# Patient Record
Sex: Female | Born: 1955
Health system: Southern US, Community
[De-identification: ages and names within clinical notes are randomized; demographics above are authoritative.]

## PROBLEM LIST (undated history)

## (undated) DIAGNOSIS — F329 Major depressive disorder, single episode, unspecified: Secondary | ICD-10-CM

## (undated) DIAGNOSIS — B019 Varicella without complication: Secondary | ICD-10-CM

## (undated) DIAGNOSIS — I10 Essential (primary) hypertension: Secondary | ICD-10-CM

## (undated) DIAGNOSIS — F32A Depression, unspecified: Secondary | ICD-10-CM

## (undated) DIAGNOSIS — E119 Type 2 diabetes mellitus without complications: Secondary | ICD-10-CM

## (undated) HISTORY — PX: BREAST BIOPSY: SHX20

## (undated) HISTORY — DX: Depression, unspecified: F32.A

## (undated) HISTORY — DX: Type 2 diabetes mellitus without complications: E11.9

## (undated) HISTORY — DX: Essential (primary) hypertension: I10

## (undated) HISTORY — DX: Varicella without complication: B01.9

## (undated) HISTORY — DX: Major depressive disorder, single episode, unspecified: F32.9

---

## 2014-04-15 ENCOUNTER — Encounter: Payer: Self-pay | Admitting: Nurse Practitioner

## 2014-04-15 ENCOUNTER — Ambulatory Visit (INDEPENDENT_AMBULATORY_CARE_PROVIDER_SITE_OTHER): Payer: BC Managed Care – PPO | Admitting: Nurse Practitioner

## 2014-04-15 VITALS — BP 122/79 | HR 74 | Temp 97.8°F | Ht 66.0 in | Wt 210.0 lb

## 2014-04-15 DIAGNOSIS — Z0000 Encounter for general adult medical examination without abnormal findings: Secondary | ICD-10-CM

## 2014-04-15 DIAGNOSIS — Z6831 Body mass index (BMI) 31.0-31.9, adult: Secondary | ICD-10-CM

## 2014-04-15 DIAGNOSIS — I10 Essential (primary) hypertension: Secondary | ICD-10-CM

## 2014-04-15 LAB — CBC WITH DIFFERENTIAL/PLATELET
BASOS PCT: 0.3 % (ref 0.0–3.0)
Basophils Absolute: 0 10*3/uL (ref 0.0–0.1)
EOS PCT: 2.9 % (ref 0.0–5.0)
Eosinophils Absolute: 0.2 10*3/uL (ref 0.0–0.7)
HCT: 43.5 % (ref 36.0–46.0)
Hemoglobin: 14.7 g/dL (ref 12.0–15.0)
Lymphocytes Relative: 22.5 % (ref 12.0–46.0)
Lymphs Abs: 1.2 10*3/uL (ref 0.7–4.0)
MCHC: 33.9 g/dL (ref 30.0–36.0)
MCV: 85.7 fl (ref 78.0–100.0)
MONOS PCT: 5 % (ref 3.0–12.0)
Monocytes Absolute: 0.3 10*3/uL (ref 0.1–1.0)
NEUTROS PCT: 69.3 % (ref 43.0–77.0)
Neutro Abs: 3.7 10*3/uL (ref 1.4–7.7)
Platelets: 270 10*3/uL (ref 150.0–400.0)
RBC: 5.08 Mil/uL (ref 3.87–5.11)
RDW: 12.9 % (ref 11.5–15.5)
WBC: 5.3 10*3/uL (ref 4.0–10.5)

## 2014-04-15 LAB — COMPREHENSIVE METABOLIC PANEL
ALT: 16 U/L (ref 0–35)
AST: 18 U/L (ref 0–37)
Albumin: 4.4 g/dL (ref 3.5–5.2)
Alkaline Phosphatase: 107 U/L (ref 39–117)
BUN: 13 mg/dL (ref 6–23)
CHLORIDE: 105 meq/L (ref 96–112)
CO2: 32 meq/L (ref 19–32)
Calcium: 9.4 mg/dL (ref 8.4–10.5)
Creatinine, Ser: 0.89 mg/dL (ref 0.40–1.20)
GFR: 68.74 mL/min (ref >60.00)
Glucose, Bld: 99 mg/dL (ref 70–99)
Potassium: 4.2 mEq/L (ref 3.5–5.1)
Sodium: 140 mEq/L (ref 135–145)
Total Bilirubin: 0.6 mg/dL (ref 0.2–1.2)
Total Protein: 7.1 g/dL (ref 6.0–8.3)

## 2014-04-15 LAB — MICROALBUMIN / CREATININE URINE RATIO
Creatinine,U: 258.2 mg/dL
Microalb Creat Ratio: 0.5 mg/g (ref 0.0–30.0)
Microalb, Ur: 1.4 mg/dL (ref 0.0–1.9)

## 2014-04-15 LAB — LIPID PANEL
Cholesterol: 220 mg/dL — ABNORMAL HIGH (ref 0–200)
HDL: 56.1 mg/dL (ref >39.00)
LDL CALC: 135 mg/dL — AB (ref 0–99)
NonHDL: 163.9
Total CHOL/HDL Ratio: 4
Triglycerides: 143 mg/dL (ref 0.0–149.0)
VLDL: 28.6 mg/dL (ref 0.0–40.0)

## 2014-04-15 LAB — TSH: TSH: 1.8 u[IU]/mL (ref 0.35–4.50)

## 2014-04-15 LAB — T4, FREE: Free T4: 0.68 ng/dL (ref 0.60–1.60)

## 2014-04-15 LAB — HEMOGLOBIN A1C: Hgb A1c MFr Bld: 6 % (ref 4.6–6.5)

## 2014-04-15 LAB — VITAMIN D 25 HYDROXY (VIT D DEFICIENCY, FRACTURES): VITD: 18.28 ng/mL — ABNORMAL LOW (ref 30.00–100.00)

## 2014-04-15 NOTE — Progress Notes (Signed)
Pre visit review using our clinic review tool, if applicable. No additional management support is needed unless otherwise documented below in the visit note. 

## 2014-04-15 NOTE — Patient Instructions (Signed)
Our office will call you with lab results. Diet changes:  Cut out refined sugar- anything that is sweet when you eat or drink it except fresh fruit.  Cut out refined grains-white bread, rolls, biscuits, bagels, muffins, pasta and cereals. Choose Bread, cereals, rice, pasta that have 4 gm or more of fiber per serving.   Exercise:  take a 30 minute walk every day. The benefits include weight loss, lower risk for heart disease, diabetes, stroke, high blood pressure, lower rates of depression & dementia, better sleep quality & bone health. Weight loss goal is 30 pounds for better health.  I will see you in 3-4 weeks.  Pleasure to meet you!  Preventive Care for Adults, Female A healthy lifestyle and preventive care can promote health and wellness. Preventive health guidelines for women include the following key practices.  A routine yearly physical is a good way to check with your caregiver about your health and preventive screening. It is a chance to share any concerns and updates on your health, and to receive a thorough exam.  Visit your dentist for a routine exam and preventive care every 6 months. Brush your teeth twice a day and floss once a day. Good oral hygiene prevents tooth decay and gum disease.  The frequency of eye exams is based on your age, health, family medical history, use of contact lenses, and other factors. Follow your caregiver's recommendations for frequency of eye exams.  Eat a healthy diet. Foods like vegetables, fruits, whole grains, low-fat dairy products, and lean protein foods contain the nutrients you need without too many calories. Decrease your intake of foods high in solid fats, added sugars, and salt. Eat the right amount of calories for you.Get information about a proper diet from your caregiver, if necessary.  Regular physical exercise is one of the most important things you can do for your health. Most adults should get at least 150 minutes of moderate-intensity  exercise (any activity that increases your heart rate and causes you to sweat) each week. In addition, most adults need muscle-strengthening exercises on 2 or more days a week.  Maintain a healthy weight. The body mass index (BMI) is a screening tool to identify possible weight problems. It provides an estimate of body fat based on height and weight. Your caregiver can help determine your BMI, and can help you achieve or maintain a healthy weight.For adults 20 years and older:  A BMI below 18.5 is considered underweight.  A BMI of 18.5 to 24.9 is normal.  A BMI of 25 to 29.9 is considered overweight.  A BMI of 30 and above is considered obese.  Maintain normal blood lipids and cholesterol levels by exercising and minimizing your intake of saturated fat. Eat a balanced diet with plenty of fruit and vegetables. Blood tests for lipids and cholesterol should begin at age 4 and be repeated every 5 years. If your lipid or cholesterol levels are high, you are over 50, or you are at high risk for heart disease, you may need your cholesterol levels checked more frequently.Ongoing high lipid and cholesterol levels should be treated with medicines if diet and exercise are not effective.  If you smoke, find out from your caregiver how to quit. If you do not use tobacco, do not start.  Lung cancer screening is recommended for adults aged 36 80 years who are at high risk for developing lung cancer because of a history of smoking. Yearly low-dose computed tomography (CT) is recommended for people  who have at least a 30-pack-year history of smoking and are a current smoker or have quit within the past 15 years. A pack year of smoking is smoking an average of 1 pack of cigarettes a day for 1 year (for example: 1 pack a day for 30 years or 2 packs a day for 15 years). Yearly screening should continue until the smoker has stopped smoking for at least 15 years. Yearly screening should also be stopped for people who  develop a health problem that would prevent them from having lung cancer treatment.  If you are pregnant, do not drink alcohol. If you are breastfeeding, be very cautious about drinking alcohol. If you are not pregnant and choose to drink alcohol, do not exceed 1 drink per day. One drink is considered to be 12 ounces (355 mL) of beer, 5 ounces (148 mL) of wine, or 1.5 ounces (44 mL) of liquor.  Avoid use of street drugs. Do not share needles with anyone. Ask for help if you need support or instructions about stopping the use of drugs.  High blood pressure causes heart disease and increases the risk of stroke. Your blood pressure should be checked at least every 1 to 2 years. Ongoing high blood pressure should be treated with medicines if weight loss and exercise are not effective.  If you are 15 to 60 years old, ask your caregiver if you should take aspirin to prevent strokes.  Diabetes screening involves taking a blood sample to check your fasting blood sugar level. This should be done once every 3 years, after age 73, if you are within normal weight and without risk factors for diabetes. Testing should be considered at a younger age or be carried out more frequently if you are overweight and have at least 1 risk factor for diabetes.  Breast cancer screening is essential preventive care for women. You should practice "breast self-awareness." This means understanding the normal appearance and feel of your breasts and may include breast self-examination. Any changes detected, no matter how small, should be reported to a caregiver. Women in their 73s and 30s should have a clinical breast exam (CBE) by a caregiver as part of a regular health exam every 1 to 3 years. After age 45, women should have a CBE every year. Starting at age 48, women should consider having a mammography (breast X-ray test) every year. Women who have a family history of breast cancer should talk to their caregiver about genetic  screening. Women at a high risk of breast cancer should talk to their caregivers about having magnetic resonance imaging (MRI) and a mammography every year.  Breast cancer gene (BRCA)-related cancer risk assessment is recommended for women who have family members with BRCA-related cancers. BRCA-related cancers include breast, ovarian, tubal, and peritoneal cancers. Having family members with these cancers may be associated with an increased risk for harmful changes (mutations) in the breast cancer genes BRCA1 and BRCA2. Results of the assessment will determine the need for genetic counseling and BRCA1 and BRCA2 testing.  The Pap test is a screening test for cervical cancer. A Pap test can show cell changes on the cervix that might become cervical cancer if left untreated. A Pap test is a procedure in which cells are obtained and examined from the lower end of the uterus (cervix).  Women should have a Pap test starting at age 37.  Between ages 35 and 6, Pap tests should be repeated every 2 years.  Beginning at age 54,  you should have a Pap test every 3 years as long as the past 3 Pap tests have been normal.  Some women have medical problems that increase the chance of getting cervical cancer. Talk to your caregiver about these problems. It is especially important to talk to your caregiver if a new problem develops soon after your last Pap test. In these cases, your caregiver may recommend more frequent screening and Pap tests.  The above recommendations are the same for women who have or have not gotten the vaccine for human papillomavirus (HPV).  If you had a hysterectomy for a problem that was not cancer or a condition that could lead to cancer, then you no longer need Pap tests. Even if you no longer need a Pap test, a regular exam is a good idea to make sure no other problems are starting.  If you are between ages 44 and 67, and you have had normal Pap tests going back 10 years, you no longer  need Pap tests. Even if you no longer need a Pap test, a regular exam is a good idea to make sure no other problems are starting.  If you have had past treatment for cervical cancer or a condition that could lead to cancer, you need Pap tests and screening for cancer for at least 20 years after your treatment.  If Pap tests have been discontinued, risk factors (such as a new sexual partner) need to be reassessed to determine if screening should be resumed.  The HPV test is an additional test that may be used for cervical cancer screening. The HPV test looks for the virus that can cause the cell changes on the cervix. The cells collected during the Pap test can be tested for HPV. The HPV test could be used to screen women aged 41 years and older, and should be used in women of any age who have unclear Pap test results. After the age of 70, women should have HPV testing at the same frequency as a Pap test.  Colorectal cancer can be detected and often prevented. Most routine colorectal cancer screening begins at the age of 55 and continues through age 48. However, your caregiver may recommend screening at an earlier age if you have risk factors for colon cancer. On a yearly basis, your caregiver may provide home test kits to check for hidden blood in the stool. Use of a small camera at the end of a tube, to directly examine the colon (sigmoidoscopy or colonoscopy), can detect the earliest forms of colorectal cancer. Talk to your caregiver about this at age 86, when routine screening begins. Direct examination of the colon should be repeated every 5 to 10 years through age 52, unless early forms of pre-cancerous polyps or small growths are found.  Hepatitis C blood testing is recommended for all people born from [birth year] through 1965 and any individual with known risks for hepatitis C.  Practice safe sex. Use condoms and avoid high-risk sexual practices to reduce the spread of sexually transmitted infections  (STIs). STIs include gonorrhea, chlamydia, syphilis, trichomonas, herpes, HPV, and human immunodeficiency virus (HIV). Herpes, HIV, and HPV are viral illnesses that have no cure. They can result in disability, cancer, and death. Sexually active women aged 23 and younger should be checked for chlamydia. Older women with new or multiple partners should also be tested for chlamydia. Testing for other STIs is recommended if you are sexually active and at increased risk.  Osteoporosis is a disease  in which the bones lose minerals and strength with aging. This can result in serious bone fractures. The risk of osteoporosis can be identified using a bone density scan. Women ages 74 and over and women at risk for fractures or osteoporosis should discuss screening with their caregivers. Ask your caregiver whether you should take a calcium supplement or vitamin D to reduce the rate of osteoporosis.  Menopause can be associated with physical symptoms and risks. Hormone replacement therapy is available to decrease symptoms and risks. You should talk to your caregiver about whether hormone replacement therapy is right for you.  Use sunscreen. Apply sunscreen liberally and repeatedly throughout the day. You should seek shade when your shadow is shorter than you. Protect yourself by wearing long sleeves, pants, a wide-brimmed hat, and sunglasses year round, whenever you are outdoors.  Once a month, do a whole body skin exam, using a mirror to look at the skin on your back. Notify your caregiver of new moles, moles that have irregular borders, moles that are larger than a pencil eraser, or moles that have changed in shape or color.  Stay current with required immunizations.  Influenza vaccine. All adults should be immunized every year.  Tetanus, diphtheria, and acellular pertussis (Td, Tdap) vaccine. Pregnant women should receive 1 dose of Tdap vaccine during each pregnancy. The dose should be obtained regardless of  the length of time since the last dose. Immunization is preferred during the 27th to 36th week of gestation. An adult who has not previously received Tdap or who does not know her vaccine status should receive 1 dose of Tdap. This initial dose should be followed by tetanus and diphtheria toxoids (Td) booster doses every 10 years. Adults with an unknown or incomplete history of completing a 3-dose immunization series with Td-containing vaccines should begin or complete a primary immunization series including a Tdap dose. Adults should receive a Td booster every 10 years.  Varicella vaccine. An adult without evidence of immunity to varicella should receive 2 doses or a second dose if she has previously received 1 dose. Pregnant females who do not have evidence of immunity should receive the first dose after pregnancy. This first dose should be obtained before leaving the health care facility. The second dose should be obtained 4 8 weeks after the first dose.  Human papillomavirus (HPV) vaccine. Females aged 74 26 years who have not received the vaccine previously should obtain the 3-dose series. The vaccine is not recommended for use in pregnant females. However, pregnancy testing is not needed before receiving a dose. If a female is found to be pregnant after receiving a dose, no treatment is needed. In that case, the remaining doses should be delayed until after the pregnancy. Immunization is recommended for any person with an immunocompromised condition through the age of 14 years if she did not get any or all doses earlier. During the 3-dose series, the second dose should be obtained 4 8 weeks after the first dose. The third dose should be obtained 24 weeks after the first dose and 16 weeks after the second dose.  Zoster vaccine. One dose is recommended for adults aged 75 years or older unless certain conditions are present.  Measles, mumps, and rubella (MMR) vaccine. Adults born before [birth year] generally are  considered immune to measles and mumps. Adults born in [birth year] or later should have 1 or more doses of MMR vaccine unless there is a contraindication to the vaccine or there is laboratory evidence of immunity  to each of the three diseases. A routine second dose of MMR vaccine should be obtained at least 28 days after the first dose for students attending postsecondary schools, health care workers, or international travelers. People who received inactivated measles vaccine or an unknown type of measles vaccine during 1963 1967 should receive 2 doses of MMR vaccine. People who received inactivated mumps vaccine or an unknown type of mumps vaccine before 1979 and are at high risk for mumps infection should consider immunization with 2 doses of MMR vaccine. For females of childbearing age, rubella immunity should be determined. If there is no evidence of immunity, females who are not pregnant should be vaccinated. If there is no evidence of immunity, females who are pregnant should delay immunization until after pregnancy. Unvaccinated health care workers born before [birth year] who lack laboratory evidence of measles, mumps, or rubella immunity or laboratory confirmation of disease should consider measles and mumps immunization with 2 doses of MMR vaccine or rubella immunization with 1 dose of MMR vaccine.  Pneumococcal 13-valent conjugate (PCV13) vaccine. When indicated, a person who is uncertain of her immunization history and has no record of immunization should receive the PCV13 vaccine. An adult aged 50 years or older who has certain medical conditions and has not been previously immunized should receive 1 dose of PCV13 vaccine. This PCV13 should be followed with a dose of pneumococcal polysaccharide (PPSV23) vaccine. The PPSV23 vaccine dose should be obtained at least 8 weeks after the dose of PCV13 vaccine. An adult aged 20 years or older who has certain medical conditions and previously received 1 or more doses of  PPSV23 vaccine should receive 1 dose of PCV13. The PCV13 vaccine dose should be obtained 1 or more years after the last PPSV23 vaccine dose.  Pneumococcal polysaccharide (PPSV23) vaccine. When PCV13 is also indicated, PCV13 should be obtained first. All adults aged 56 years and older should be immunized. An adult younger than age 62 years who has certain medical conditions should be immunized. Any person who resides in a nursing home or long-term care facility should be immunized. An adult smoker should be immunized. People with an immunocompromised condition and certain other conditions should receive both PCV13 and PPSV23 vaccines. People with human immunodeficiency virus (HIV) infection should be immunized as soon as possible after diagnosis. Immunization during chemotherapy or radiation therapy should be avoided. Routine use of PPSV23 vaccine is not recommended for American Indians, [place] Natives, or people younger than 65 years unless there are medical conditions that require PPSV23 vaccine. When indicated, people who have unknown immunization and have no record of immunization should receive PPSV23 vaccine. One-time revaccination 5 years after the first dose of PPSV23 is recommended for people aged 30 64 years who have chronic kidney failure, nephrotic syndrome, asplenia, or immunocompromised conditions. People who received 1 2 doses of PPSV23 before age 38 years should receive another dose of PPSV23 vaccine at age 17 years or later if at least 5 years have passed since the previous dose. Doses of PPSV23 are not needed for people immunized with PPSV23 at or after age 58 years.  Meningococcal vaccine. Adults with asplenia or persistent complement component deficiencies should receive 2 doses of quadrivalent meningococcal conjugate (MenACWY-D) vaccine. The doses should be obtained at least 2 months apart. Microbiologists working with certain meningococcal bacteria, [place] [employer], people at risk during  an outbreak, and people who travel to or live in countries with a high rate of meningitis should be immunized. A first-year college  student up through age 2 years who is living in a residence hall should receive a dose if she did not receive a dose on or after her 16th birthday. Adults who have certain high-risk conditions should receive one or more doses of vaccine.  Hepatitis A vaccine. Adults who wish to be protected from this disease, have certain high-risk conditions, work with hepatitis A-infected animals, work in hepatitis A research labs, or travel to or work in countries with a high rate of hepatitis A should be immunized. Adults who were previously unvaccinated and who anticipate close contact with an international adoptee during the first 60 days after arrival in the [place] from a country with a high rate of hepatitis A should be immunized.  Hepatitis B vaccine. Adults who wish to be protected from this disease, have certain high-risk conditions, may be exposed to blood or other infectious body fluids, are household contacts or sex partners of hepatitis B positive people, are clients or workers in certain care facilities, or travel to or work in countries with a high rate of hepatitis B should be immunized.  Haemophilus influenzae type b (Hib) vaccine. A previously unvaccinated person with asplenia or sickle cell disease or having a scheduled splenectomy should receive 1 dose of Hib vaccine. Regardless of previous immunization, a recipient of a hematopoietic stem cell transplant should receive a 3-dose series 6 12 months after her successful transplant. Hib vaccine is not recommended for adults with HIV infection. Preventive Services / Frequency Ages 29 to 37  Blood pressure check.** / Every 1 to 2 years.  Lipid and cholesterol check.** / Every 5 years beginning at age 73.  Clinical breast exam.** / Every 3 years for women in their 28s and 66s.  BRCA-related cancer risk  assessment.** / For women who have family members with a BRCA-related cancer (breast, ovarian, tubal, or peritoneal cancers).  Pap test.** / Every 2 years from ages 66 through 79. Every 3 years starting at age 51 through age 44 or 23 with a history of 3 consecutive normal Pap tests.  HPV screening.** / Every 3 years from ages 45 through ages 70 to 21 with a history of 3 consecutive normal Pap tests.  Hepatitis C blood test.** / For any individual with known risks for hepatitis C.  Skin self-exam. / Monthly.  Influenza vaccine. / Every year.  Tetanus, diphtheria, and acellular pertussis (Tdap, Td) vaccine.** / Consult your caregiver. Pregnant women should receive 1 dose of Tdap vaccine during each pregnancy. 1 dose of Td every 10 years.  Varicella vaccine.** / Consult your caregiver. Pregnant females who do not have evidence of immunity should receive the first dose after pregnancy.  HPV vaccine. / 3 doses over 6 months, if 37 and younger. The vaccine is not recommended for use in pregnant females. However, pregnancy testing is not needed before receiving a dose.  Measles, mumps, rubella (MMR) vaccine.** / You need at least 1 dose of MMR if you were born in 1957 or later. You may also need a 2nd dose. For females of childbearing age, rubella immunity should be determined. If there is no evidence of immunity, females who are not pregnant should be vaccinated. If there is no evidence of immunity, females who are pregnant should delay immunization until after pregnancy.  Pneumococcal 13-valent conjugate (PCV13) vaccine.** / Consult your caregiver.  Pneumococcal polysaccharide (PPSV23) vaccine.** / 1 to 2 doses if you smoke cigarettes or if you have certain conditions.  Meningococcal vaccine.** / 1  dose if you are age 55 to 56 years and a Market researcher living in a residence hall, or have one of several medical conditions, you need to get vaccinated against meningococcal disease. You  may also need additional booster doses.  Hepatitis A vaccine.** / Consult your caregiver.  Hepatitis B vaccine.** / Consult your caregiver.  Haemophilus influenzae type b (Hib) vaccine.** / Consult your caregiver. Ages 61 to 66  Blood pressure check.** / Every 1 to 2 years.  Lipid and cholesterol check.** / Every 5 years beginning at age 62.  Lung cancer screening. / Every year if you are aged 43 80 years and have a 30-pack-year history of smoking and currently smoke or have quit within the past 15 years. Yearly screening is stopped once you have quit smoking for at least 15 years or develop a health problem that would prevent you from having lung cancer treatment.  Clinical breast exam.** / Every year after age 61.  BRCA-related cancer risk assessment.** / For women who have family members with a BRCA-related cancer (breast, ovarian, tubal, or peritoneal cancers).  Mammogram.** / Every year beginning at age 75 and continuing for as long as you are in good health. Consult with your caregiver.  Pap test.** / Every 3 years starting at age 88 through age 66 or 36 with a history of 3 consecutive normal Pap tests.  HPV screening.** / Every 3 years from ages 31 through ages 41 to 71 with a history of 3 consecutive normal Pap tests.  Fecal occult blood test (FOBT) of stool. / Every year beginning at age 37 and continuing until age 52. You may not need to do this test if you get a colonoscopy every 10 years.  Flexible sigmoidoscopy or colonoscopy.** / Every 5 years for a flexible sigmoidoscopy or every 10 years for a colonoscopy beginning at age 72 and continuing until age 36.  Hepatitis C blood test.** / For all people born from [birth year] through 1966 and any individual with known risks for hepatitis C.  Skin self-exam. / Monthly.  Influenza vaccine. / Every year.  Tetanus, diphtheria, and acellular pertussis (Tdap/Td) vaccine.** / Consult your caregiver. Pregnant women should receive 1 dose  of Tdap vaccine during each pregnancy. 1 dose of Td every 10 years.  Varicella vaccine.** / Consult your caregiver. Pregnant females who do not have evidence of immunity should receive the first dose after pregnancy.  Zoster vaccine.** / 1 dose for adults aged 47 years or older.  Measles, mumps, rubella (MMR) vaccine.** / You need at least 1 dose of MMR if you were born in 1957 or later. You may also need a 2nd dose. For females of childbearing age, rubella immunity should be determined. If there is no evidence of immunity, females who are not pregnant should be vaccinated. If there is no evidence of immunity, females who are pregnant should delay immunization until after pregnancy.  Pneumococcal 13-valent conjugate (PCV13) vaccine.** / Consult your caregiver.  Pneumococcal polysaccharide (PPSV23) vaccine.** / 1 to 2 doses if you smoke cigarettes or if you have certain conditions.  Meningococcal vaccine.** / Consult your caregiver.  Hepatitis A vaccine.** / Consult your caregiver.  Hepatitis B vaccine.** / Consult your caregiver.  Haemophilus influenzae type b (Hib) vaccine.** / Consult your caregiver. Ages 43 and over  Blood pressure check.** / Every 1 to 2 years.  Lipid and cholesterol check.** / Every 5 years beginning at age 79.  Lung cancer screening. / Every year if you  are aged 52 80 years and have a 30-pack-year history of smoking and currently smoke or have quit within the past 15 years. Yearly screening is stopped once you have quit smoking for at least 15 years or develop a health problem that would prevent you from having lung cancer treatment.  Clinical breast exam.** / Every year after age 42.  BRCA-related cancer risk assessment.** / For women who have family members with a BRCA-related cancer (breast, ovarian, tubal, or peritoneal cancers).  Mammogram.** / Every year beginning at age 6 and continuing for as long as you are in good health. Consult with your  caregiver.  Pap test.** / Every 3 years starting at age 35 through age 15 or 15 with a 3 consecutive normal Pap tests. Testing can be stopped between 65 and 70 with 3 consecutive normal Pap tests and no abnormal Pap or HPV tests in the past 10 years.  HPV screening.** / Every 3 years from ages 29 through ages 36 or 14 with a history of 3 consecutive normal Pap tests. Testing can be stopped between 65 and 70 with 3 consecutive normal Pap tests and no abnormal Pap or HPV tests in the past 10 years.  Fecal occult blood test (FOBT) of stool. / Every year beginning at age 29 and continuing until age 57. You may not need to do this test if you get a colonoscopy every 10 years.  Flexible sigmoidoscopy or colonoscopy.** / Every 5 years for a flexible sigmoidoscopy or every 10 years for a colonoscopy beginning at age 51 and continuing until age 73.  Hepatitis C blood test.** / For all people born from [birth year] through 1966 and any individual with known risks for hepatitis C.  Osteoporosis screening.** / A one-time screening for women ages 29 and over and women at risk for fractures or osteoporosis.  Skin self-exam. / Monthly.  Influenza vaccine. / Every year.  Tetanus, diphtheria, and acellular pertussis (Tdap/Td) vaccine.** / 1 dose of Td every 10 years.  Varicella vaccine.** / Consult your caregiver.  Zoster vaccine.** / 1 dose for adults aged 27 years or older.  Pneumococcal 13-valent conjugate (PCV13) vaccine.** / Consult your caregiver.  Pneumococcal polysaccharide (PPSV23) vaccine.** / 1 dose for all adults aged 59 years and older.  Meningococcal vaccine.** / Consult your caregiver.  Hepatitis A vaccine.** / Consult your caregiver.  Hepatitis B vaccine.** / Consult your caregiver.  Haemophilus influenzae type b (Hib) vaccine.** / Consult your caregiver. ** Family history and personal history of risk and conditions may change your caregiver's recommendations. Document Released: 03/15/2001  Document Revised: 05/14/2012 Document Reviewed: 06/14/2010 [facility] Patient Information 2014 [place], [place].

## 2014-04-16 ENCOUNTER — Telehealth: Payer: Self-pay | Admitting: Nurse Practitioner

## 2014-04-16 LAB — HIV ANTIBODY (ROUTINE TESTING W REFLEX): HIV 1&2 Ab, 4th Generation: NONREACTIVE

## 2014-04-16 NOTE — Telephone Encounter (Signed)
emmi emailed °

## 2014-04-16 NOTE — Telephone Encounter (Signed)
pls call pt: Advise Labs: she has vit d deficiency. Start D3 5000 iu daily w/meal. She has prediabetes. Continue with diet changes discussed in ofc. We will discuss further at f/u appt.

## 2014-04-16 NOTE — Telephone Encounter (Signed)
Called and informed patient of results.

## 2014-04-17 ENCOUNTER — Encounter: Payer: Self-pay | Admitting: Nurse Practitioner

## 2014-04-17 NOTE — Progress Notes (Signed)
Subjective:     Diana Rios is a 59 y.o. female and is here to establish care. The patient reports problems - HTN, depression, and desire to lose weight.. She relocated from [facility]. Historical care w/ Dr Dicie Beam health. Last PP 2014 nml. Last MMG 1/15 nml. Prev abnml-chip in breast. Went through menopause about 5 yrs ago. HTN: current med lisinopril 40 mg qd. No intolerable SE. RF: obese, sedentary.    Depression: current meds cymbalta 60 mg qd, wellbutrin 150 mg recently added. Taking cymbalta for several yrs. Symptoms controlled. desire to lose weight: struggled w/wt as adult. Used weight watcher's for years with success-lost 40 lbs. Always felt [place]. Didn't keep off. Eats fair amount of sugar, drinks diet sodas. Not exercsiing History   Social History  . Marital Status: Married    Spouse Name: N/A  . Number of Children: 2  . Years of Education: [employer]   Occupational History  . Geologist, engineering     Retired   Social History Main Topics  . Smoking status: Never Smoker   . Smokeless tobacco: Not on file  . Alcohol Use: No  . Drug Use: No  . Sexual Activity: Yes    Birth Control/ Protection: Post-menopausal   Other Topics Concern  . Not on file   Social History Narrative   Ms Bahr relocated from [place], [place]. She lived in [place], [place] for short while. 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   Health Maintenance  Topic Date Due  . ZOSTAVAX  03/15/2014  . COLONOSCOPY  04/15/2015 (Originally 03/15/2004)  . INFLUENZA VACCINE  09/01/2014  . MAMMOGRAM  04/15/2015  . PAP SMEAR  04/15/2015  . TETANUS/TDAP  04/14/2020  . HIV Screening  Completed    The following portions of the patient's history were reviewed and updated as appropriate: allergies, current medications, past family history, past medical history, past social history, past surgical history and problem list.  Review of Systems Constitutional: negative for chills, fatigue and  fevers Eyes: negative for visual disturbance Ears, nose, mouth, throat, and face: negative for hoarseness Respiratory: negative for cough Cardiovascular: negative for chest pressure/discomfort and irregular heart beat Gastrointestinal: negative for constipation, diarrhea and reflux symptoms Genitourinary:negative for postmenopausal bleeding Musculoskeletal:positive for arthralgias and hands Neurological: negative for headaches Behavioral/Psych: negative for excessive alcohol consumption and sleep disturbance   Objective:    BP 122/79 mmHg  Pulse 74  Temp(Src) 97.8 F (36.6 C) (Temporal)  Ht  (1.676 m)  Wt 210 lb (95.255 kg)  BMI 33.91 kg/m2  SpO2 96% General appearance: alert, cooperative, appears stated age and no distress Head: Normocephalic, without obvious abnormality, atraumatic Eyes: negative findings: lids and lashes normal, conjunctivae and sclerae normal, corneas clear and pupils equal, round, reactive to light and accomodation Ears: normal TM's and external ear canals both ears Throat: lips, mucosa, and tongue normal; teeth and gums normal Neck: no adenopathy, no carotid bruit, supple, symmetrical, trachea midline and thyroid not enlarged, symmetric, no tenderness/mass/nodules Back: no kyphosis present, range of motion normal Lungs: clear to auscultation bilaterally Heart: regular rate and rhythm, S1, S2 normal, no murmur, click, rub or gallop Abdomen: soft, non-tender; bowel sounds normal; no masses,  no organomegaly Extremities: extremities normal, atraumatic, no cyanosis or edema Pulses: 2+ and symmetric Lymph nodes: Cervical adenopathy: none and Supraclavicular adenopathy: none Neurologic: Grossly normal    Assessment:Plan  1. BMI 31.0-31.9,adult Discussed health reasons to normalize BMI Discussed specific diet changes & activity Gave hand out. -  TSH - T4, free - Lipid panel - Hemoglobin A1c  2. Preventative health care Encourage reg exercise - CBC  with Differential/Platelet - Comprehensive metabolic panel - Microalbumin / creatinine urine ratio - TSH - T4, free - Lipid panel - Hemoglobin A1c - HIV antibody - Vit D  25 hydroxy (rtn osteoporosis monitoring) - MM DIGITAL SCREENING BILATERAL; Future  3. Essential hypertension Continue lisinopril Exercise Wt loss - Comprehensive metabolic panel - Microalbumin / creatinine urine ratio  F/u 3-4 weeks-wt check, nutrition counseling

## 2014-04-21 ENCOUNTER — Encounter: Payer: Self-pay | Admitting: Nurse Practitioner

## 2014-04-30 ENCOUNTER — Telehealth: Payer: Self-pay

## 2014-04-30 NOTE — Telephone Encounter (Signed)
Patient called back and she said that she believed she has a bill for $104.00 for the HIV test. Patient stated that you said that the insurance would pay for the HIV Test. Dawn said that we might just need to try to change the diagnosis code for the test to the HIV Screening instead of the preventative health care. Do you have any input on what we can do for patient?

## 2014-04-30 NOTE — Telephone Encounter (Signed)
LMOVM for patient to call back to discuss the  HIV testing bill that she received in the mail from the lab.

## 2014-05-01 ENCOUNTER — Ambulatory Visit (INDEPENDENT_AMBULATORY_CARE_PROVIDER_SITE_OTHER): Payer: BC Managed Care – PPO

## 2014-05-01 DIAGNOSIS — R928 Other abnormal and inconclusive findings on diagnostic imaging of breast: Secondary | ICD-10-CM | POA: Diagnosis not present

## 2014-05-01 DIAGNOSIS — Z0000 Encounter for general adult medical examination without abnormal findings: Secondary | ICD-10-CM

## 2014-05-05 ENCOUNTER — Other Ambulatory Visit: Payer: Self-pay | Admitting: Nurse Practitioner

## 2014-05-05 DIAGNOSIS — R928 Other abnormal and inconclusive findings on diagnostic imaging of breast: Secondary | ICD-10-CM

## 2014-05-05 NOTE — Telephone Encounter (Signed)
Z11.4 is the screening for HIV. This was an outside lab and they are the ones that's billing the patient, so they would need to refile with the correct code. You may need to send an order to them and ask them to refile with the new diagnosis. Not sure how that works through [employer].  Let me know if you have other questions. Thanks.

## 2014-05-05 NOTE — Telephone Encounter (Signed)
I have contacted [facility] and spoke to them in regards to the charge is question. They are going to take care of it and re-bill the insurance company. Please call pt. and let her know. Thx Jewel Baizearcy

## 2014-05-05 NOTE — Telephone Encounter (Signed)
Can you assist with this? Change code to HIV screen? Thank you!

## 2014-05-05 NOTE — Telephone Encounter (Signed)
Misty StanleyLisa, do you know who we would need to contact for this? Thanks

## 2014-05-05 NOTE — Telephone Encounter (Signed)
Called and informed patient. 

## 2014-05-05 NOTE — Telephone Encounter (Signed)
Please assist

## 2014-05-05 NOTE — Telephone Encounter (Signed)
pls see dawn's msg below. pls find out what lab does HIV test & call them & ask to file with correct code for ins reimbursement. TY!

## 2014-05-06 ENCOUNTER — Other Ambulatory Visit: Payer: Self-pay | Admitting: *Deleted

## 2014-05-14 ENCOUNTER — Ambulatory Visit: Payer: BC Managed Care – PPO | Admitting: Nurse Practitioner

## 2014-05-16 ENCOUNTER — Ambulatory Visit
Admission: RE | Admit: 2014-05-16 | Discharge: 2014-05-16 | Disposition: A | Discharge status: Home / Self Care | Payer: BC Managed Care – PPO | Source: Ambulatory Visit | Attending: Nurse Practitioner | Admitting: Nurse Practitioner

## 2014-05-16 DIAGNOSIS — R928 Other abnormal and inconclusive findings on diagnostic imaging of breast: Secondary | ICD-10-CM

## 2014-06-03 ENCOUNTER — Other Ambulatory Visit: Payer: Self-pay

## 2014-06-03 DIAGNOSIS — F32A Depression, unspecified: Secondary | ICD-10-CM

## 2014-06-03 DIAGNOSIS — F329 Major depressive disorder, single episode, unspecified: Secondary | ICD-10-CM

## 2014-06-03 DIAGNOSIS — I10 Essential (primary) hypertension: Secondary | ICD-10-CM

## 2014-06-03 DIAGNOSIS — F4323 Adjustment disorder with mixed anxiety and depressed mood: Secondary | ICD-10-CM

## 2014-06-03 MED ORDER — LISINOPRIL 40 MG PO TABS: 40.0000 mg | ORAL_TABLET | Freq: Every day | ORAL | Status: DC

## 2014-06-03 MED ORDER — DULOXETINE HCL 60 MG PO CPEP: 60.0000 mg | ORAL_CAPSULE | Freq: Every day | ORAL | Status: DC

## 2014-06-03 MED ORDER — BUPROPION HCL ER (XL) 150 MG PO TB24: 150.0000 mg | ORAL_TABLET | Freq: Every day | ORAL | Status: DC

## 2014-06-03 NOTE — Telephone Encounter (Signed)
Please Advise Refill Request? Refill request for- Duloxetine, Bupropion, Lisinopril Last filled by MD on - never Last Appt - 04/15/14        Next Appt - 06/11/14 [facility]

## 2014-06-11 ENCOUNTER — Ambulatory Visit: Payer: BC Managed Care – PPO | Admitting: Nurse Practitioner

## 2014-07-17 ENCOUNTER — Other Ambulatory Visit: Payer: Self-pay | Admitting: Family Medicine

## 2014-07-17 DIAGNOSIS — F329 Major depressive disorder, single episode, unspecified: Secondary | ICD-10-CM

## 2014-07-17 DIAGNOSIS — F32A Depression, unspecified: Secondary | ICD-10-CM

## 2014-07-17 MED ORDER — BUPROPION HCL ER (XL) 150 MG PO TB24: 150.0000 mg | ORAL_TABLET | Freq: Every day | ORAL | Status: DC

## 2014-07-17 MED ORDER — DULOXETINE HCL 60 MG PO CPEP: 60.0000 mg | ORAL_CAPSULE | Freq: Every day | ORAL | Status: DC

## 2014-07-17 NOTE — Telephone Encounter (Signed)
pls call pt: Ask if still coming to this ofc. She has canceled last 2 appts. She was to f/u on prediabetes & low vit D. I am holding off filling prescription request until we knoe if she is coming back.

## 2014-07-17 NOTE — Telephone Encounter (Signed)
Patient states she is coming back to this office.   She couldn't make appointment at time of call but she states she will CB.  I told her to schedule within the next 30 days.

## 2014-08-05 ENCOUNTER — Encounter: Payer: Self-pay | Admitting: Nurse Practitioner

## 2014-08-05 ENCOUNTER — Ambulatory Visit (INDEPENDENT_AMBULATORY_CARE_PROVIDER_SITE_OTHER): Payer: BC Managed Care – PPO | Admitting: Nurse Practitioner

## 2014-08-05 VITALS — BP 127/84 | HR 70 | Temp 98.0°F | Resp 16 | Ht 66.0 in | Wt 205.0 lb

## 2014-08-05 DIAGNOSIS — I10 Essential (primary) hypertension: Secondary | ICD-10-CM | POA: Diagnosis not present

## 2014-08-05 DIAGNOSIS — F329 Major depressive disorder, single episode, unspecified: Secondary | ICD-10-CM

## 2014-08-05 DIAGNOSIS — E559 Vitamin D deficiency, unspecified: Secondary | ICD-10-CM

## 2014-08-05 DIAGNOSIS — F32A Depression, unspecified: Secondary | ICD-10-CM

## 2014-08-05 MED ORDER — LISINOPRIL 40 MG PO TABS: 40.0000 mg | ORAL_TABLET | Freq: Every day | ORAL | Status: DC

## 2014-08-05 MED ORDER — VITAMIN D3 1.25 MG (50000 UT) PO CAPS: 1.0000 | ORAL_CAPSULE | ORAL | Status: DC

## 2014-08-05 MED ORDER — DULOXETINE HCL 60 MG PO CPEP: 60.0000 mg | ORAL_CAPSULE | Freq: Every day | ORAL | Status: DC

## 2014-08-05 MED ORDER — BUPROPION HCL ER (XL) 150 MG PO TB24: 150.0000 mg | ORAL_TABLET | Freq: Every day | ORAL | Status: DC

## 2014-08-05 NOTE — Progress Notes (Signed)
Pre visit review using our clinic review tool, if applicable. No additional management support is needed unless otherwise documented below in the visit note. 

## 2014-08-05 NOTE — Progress Notes (Signed)
Subjective:     Diana Rios is a 59 y.o. female presents for f/u of HTN, vit D deficiency and depression.  Htn: well controlled, No intol SE. No urine microalb, encourage reg exercise. Intentionally lost 5 lb in 3 mos. With diet changes & increasing exercise. Discussed additional interventions for healthy weight & best health.  Depression: historically seeing NP w/[facility]. No recent changes. No intolerable SE. Needs refills.  Vit D def: did not start prescription strength D3 50000 iu.  The following portions of the patient's history were reviewed and updated as appropriate: allergies, current medications, past medical history, past social history, past surgical history and problem list.  Review of Systems Constitutional: negative for fatigue Respiratory: negative for cough and dyspnea on exertion Cardiovascular: negative for irregular heart beat, lower extremity edema and palpitations    Objective:    There were no vitals taken for this visit. BP 127/84 mmHg  Pulse 70  Temp(Src) 98 F (36.7 C) (Oral)  Resp 16  Ht 5\' 6"  (1.676 m)  Wt 205 lb (92.987 kg)  BMI 33.10 kg/m2  SpO2 94% General appearance: alert, cooperative, appears stated age and no distress Eyes: negative findings: lids and lashes normal and conjunctivae and sclerae normal Lungs: clear to auscultation bilaterally Heart: regular rate and rhythm, S1, S2 normal, no murmur, click, rub or gallop Neurologic: Grossly normal    Assessment:Plan   1. Vitamin D deficiency - Vit D  25 hydroxy (rtn osteoporosis monitoring); Future - Cholecalciferol (VITAMIN D3) 50000 UNITS CAPS; Take 1 capsule by mouth every 7 (seven) days.  Dispense: 12 capsule; Refill: 0 F/u in 3 mos 2. Depressed Stable, no changes - buPROPion (WELLBUTRIN XL) 150 MG 24 hr tablet; Take 1 tablet (150 mg total) by mouth daily.  Dispense: 30 tablet; Refill: 2 - DULoxetine (CYMBALTA) 60 MG capsule; Take 1 capsule (60 mg total) by mouth  daily.  Dispense: 30 capsule; Refill: 2  3. Essential hypertension, benign Well controlled, no changes - lisinopril (PRINIVIL,ZESTRIL) 40 MG tablet; Take 1 tablet (40 mg total) by mouth daily.  Dispense: 30 tablet; Refill: 2  F/u 3 mos-vit D, cmet, depression

## 2014-08-05 NOTE — Patient Instructions (Addendum)
Please start prescription vitamin D. Take 1 capsule weekly with meal for best absorption.  Return in 3 months to have level checked.  GREAT job with weight loss! Increase exercise to 30 minutes daily-take a walk!  Refer to meal plan as a guide for choosing nutrient dense foods.   It was a pleasure to care for you!

## 2014-08-06 ENCOUNTER — Ambulatory Visit: Payer: BC Managed Care – PPO | Admitting: Nurse Practitioner

## 2014-10-29 ENCOUNTER — Other Ambulatory Visit: Payer: Self-pay | Admitting: Family Medicine

## 2014-10-29 DIAGNOSIS — I10 Essential (primary) hypertension: Secondary | ICD-10-CM

## 2014-10-29 MED ORDER — LISINOPRIL 40 MG PO TABS: 40.0000 mg | ORAL_TABLET | Freq: Every day | ORAL | Status: DC

## 2014-11-17 ENCOUNTER — Telehealth: Payer: Self-pay | Admitting: Family Medicine

## 2014-11-17 ENCOUNTER — Other Ambulatory Visit: Payer: Self-pay | Admitting: *Deleted

## 2014-11-17 DIAGNOSIS — F329 Major depressive disorder, single episode, unspecified: Secondary | ICD-10-CM

## 2014-11-17 DIAGNOSIS — F32A Depression, unspecified: Secondary | ICD-10-CM

## 2014-11-17 MED ORDER — DULOXETINE HCL 60 MG PO CPEP: 60.0000 mg | ORAL_CAPSULE | Freq: Every day | ORAL | Status: DC

## 2014-11-17 MED ORDER — BUPROPION HCL ER (XL) 150 MG PO TB24: 150.0000 mg | ORAL_TABLET | Freq: Every day | ORAL | Status: DC

## 2014-11-17 NOTE — Telephone Encounter (Signed)
Refilled for 1 month.

## 2014-11-17 NOTE — Telephone Encounter (Signed)
Patient has office visit scheduled for 11/20/14 refilled wellbutrin and cymbalta for 1 month

## 2014-11-17 NOTE — Telephone Encounter (Signed)
Patient made an appt for 11/20/14. She is running out of Wellbutrin & Cymbalta. [facility]

## 2014-11-20 ENCOUNTER — Encounter: Payer: Self-pay | Admitting: Family Medicine

## 2014-11-20 ENCOUNTER — Ambulatory Visit (INDEPENDENT_AMBULATORY_CARE_PROVIDER_SITE_OTHER): Payer: BC Managed Care – PPO | Admitting: Family Medicine

## 2014-11-20 VITALS — BP 138/80 | HR 77 | Temp 97.9°F | Resp 20 | Wt 198.0 lb

## 2014-11-20 DIAGNOSIS — Z299 Encounter for prophylactic measures, unspecified: Secondary | ICD-10-CM | POA: Diagnosis not present

## 2014-11-20 DIAGNOSIS — F32A Depression, unspecified: Secondary | ICD-10-CM

## 2014-11-20 DIAGNOSIS — F418 Other specified anxiety disorders: Secondary | ICD-10-CM | POA: Diagnosis not present

## 2014-11-20 DIAGNOSIS — Z0001 Encounter for general adult medical examination with abnormal findings: Secondary | ICD-10-CM | POA: Insufficient documentation

## 2014-11-20 DIAGNOSIS — F329 Major depressive disorder, single episode, unspecified: Secondary | ICD-10-CM

## 2014-11-20 DIAGNOSIS — I10 Essential (primary) hypertension: Secondary | ICD-10-CM | POA: Diagnosis not present

## 2014-11-20 DIAGNOSIS — E559 Vitamin D deficiency, unspecified: Secondary | ICD-10-CM

## 2014-11-20 LAB — VITAMIN D 25 HYDROXY (VIT D DEFICIENCY, FRACTURES): VITD: 32.55 ng/mL (ref 30.00–100.00)

## 2014-11-20 MED ORDER — ZOSTER VACCINE LIVE 19400 UNT/0.65ML ~~LOC~~ SOLR: 0.6500 mL | Freq: Once | SUBCUTANEOUS | Status: DC

## 2014-11-20 MED ORDER — LISINOPRIL 40 MG PO TABS: 40.0000 mg | ORAL_TABLET | Freq: Every day | ORAL | Status: DC

## 2014-11-20 MED ORDER — DULOXETINE HCL 60 MG PO CPEP: 60.0000 mg | ORAL_CAPSULE | Freq: Every day | ORAL | Status: DC

## 2014-11-20 MED ORDER — BUPROPION HCL ER (XL) 150 MG PO TB24: 150.0000 mg | ORAL_TABLET | Freq: Every day | ORAL | Status: DC

## 2014-11-20 NOTE — Patient Instructions (Signed)
It was a pleasure to met you.  Monitor your BP at home n occassions. If you see above 140/90 then I will need to see you sooner, otherwise I will see you in March for your preventive (after 15th). Continue low salt diet and exercise. Great job on losing the weight.

## 2014-11-20 NOTE — Progress Notes (Signed)
Subjective:    Patient ID: Diana Rios, female    DOB: 06-02-1955, 59 y.o.   MRN: [id number]  HPI  Essential hypertension, benign: Patient reports compliance with her lisinopril 40 mg daily daily. She attempts to watch the salt content in her diet. She does exercise frequently, and has recently lost more than 10 pounds. She does not take her blood pressure in the outpatient setting. She denies any chest pain, shortness of breath, lower leg edema, headaches or visual changes.  Vitamin D deficiency: Patient had a low vitamin D of approximately 7918, and was started on medication in July. She states she has not had her vitamin D rechecked, she's down to the last 1-2 pills of her vitamin D supplementation. Reports no negative side effects, she does report having a little bit more energy.  Depression with anxiety: Patient states she has been on Wellbutrin and Cymbalta for "quite some time ". She has struggled with depression and anxiety the majority of her life. She has not seen a psychiatrist or been admitted for her depression/anxiety. She has always been well-controlled with the above medications. Patient states she feels stable, but needs refills on her medications today.  Encounter for preventive measure Health maintenance:  Colonoscopy: Patient has never had a colonoscopy. She does not have any family history of colon cancer. She states she does not desire to have a colonoscopy. Mammogram: Mammogram up-to-date 2016 Cervical cancer screening: Patient reports Pap smear 2014, follows with GYN Immunizations: Tetanus up-to-date, influenza up-to-date, Zostavax indicated Infectious disease screening: HIV completed, hepatitis indicated  - zoster vaccine live, PF, (ZOSTAVAX) [id number] UNT/0.65ML injection; Inject 19,400 Units into the skin once.  Dispense: 1 each; Refill: 0  Past Medical History  Diagnosis Date  . Depression   . Hypertension   . Chicken pox    Allergies  Allergen Reactions  .  Codeine Itching  . Penicillins Itching   History reviewed. No pertinent past surgical history. Family History  Problem Relation Age of Onset  . Heart disease Mother 4062    premature  . Diabetes Mother   . Hypertension Father   . Heart disease Sister     heart defect  . Diabetes Daughter   . Cancer Paternal Grandmother    Social History   Social History  . Marital Status: Married    Spouse Name: N/A  . Number of Children: 2  . Years of Education: [employer]   Occupational History  . Geologist, engineeringTeacher Assistant     Retired   Social History Main Topics  . Smoking status: Never Smoker   . Smokeless tobacco: Not on file  . Alcohol Use: No  . Drug Use: No  . Sexual Activity: Yes    Birth Control/ Protection: Post-menopausal   Other Topics Concern  . Not on file   Social History Narrative   [name] relocated from [place], [place]. She lived in [place], [place] for short while. 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    Review of Systems Negative, with the exception of above mentioned in HPI     Objective:   Physical Exam BP 138/80 mmHg  Pulse 77  Temp(Src) 97.9 F (36.6 C) (Oral)  Resp 20  Wt 198 lb (89.812 kg)  SpO2 96%  Body mass index is 31.97 kg/(m^2). Gen: Afebrile. No acute distress. Nontoxic in appearance, well-developed, well-nourished female. Pleasant. HENT: AT. [place].MMM.  Eyes:Pupils Equal Round Reactive to light, Extraocular movements intact,  Conjunctiva without redness, discharge or  icterus. Neck/lymp/endocrine: Supple, no lymphadenopathy, no thyromegaly CV: RRR no murmur appreciated, no edema, +2/4 P posterior tibialis pulses Chest: CTAB, no wheeze or crackles Abd: Soft. Round. NTND. BS present. No Masses palpated.   Neuro: Normal gait. PERLA. EOMi. Alert. Oriented x3  Psych: Normal affect, dress and demeanor. Normal speech. Normal thought content and judgment..     Assessment & Plan:  1. Essential hypertension, benign - Stable today.  Patient educated on normal blood pressure parameters. She is to take her blood pressure at home at least once a week, and more frequently if she has any hypertensive symptoms. Patient was encouraged if her blood pressure consistently is above 140/90 she is to make an appointment to be seen. Otherwise she has been stable for quite some time on this medication, we will follow-up in 4-6 months. - lisinopril (PRINIVIL,ZESTRIL) 40 MG tablet; Take 1 tablet (40 mg total) by mouth daily.  Dispense: 30 tablet; Refill: 3  2. Vitamin D deficiency - Repeat vitamin D today, it still deficient would call in another round of prescribed vitamin D, otherwise patient encouraged to take 800 units of vitamin D daily. - Vitamin D (25 hydroxy)  3. Depression with anxiety - Patient reports she is stable on her current medications. - Refills prescribed today. Patient will be changing her pharmacy at the end of December to a mail-in pharmacy, she should not have appointment to have this completed. Encouraged her to call in with her mail-in harmacy information when it's time for refills, and we will be able to call in her refills to the new location. - buPROPion (WELLBUTRIN XL) 150 MG 24 hr tablet; Take 1 tablet (150 mg total) by mouth daily.  Dispense: 30 tablet; Refill: 3 - DULoxetine (CYMBALTA) 60 MG capsule; Take 1 capsule (60 mg total) by mouth daily.  Dispense: 30 capsule; Refill: 3  4. Encounter for preventive measure - zoster vaccine live, PF, (ZOSTAVAX) [id number] UNT/0.65ML injection; Inject 19,400 Units into the skin once.  Dispense: 1 each; Refill: 0 Health maintenance:  Colonoscopy: Patient has never had a colonoscopy. She does not have any family history of colon cancer. She states she does not desire to have a colonoscopy. Discussed with patient the different types of screening, and their sensitivities. Patient still declined having a colonoscopy. She may be open to a fecal occult testing at her  preventative. Mammogram: Mammogram up-to-date 2016 Cervical cancer screening: Patient reports Pap smear 2014, follows with GYN Immunizations: Tetanus up-to-date, influenza up-to-date, Zostavax supplied today Infectious disease screening: HIV completed, hepatitis indicated DEXA: Patient Caucasian, low vitamin D, estrogen deficient. Discussed this with patient today. She wanted to wait, so we decided to order DEXA scan with her next mammogram which is in March.   Next appointment March for preventive visit, and last vitamin D abnormal, or needed sooner.

## 2014-11-21 ENCOUNTER — Telehealth: Payer: Self-pay | Admitting: Family Medicine

## 2014-11-21 DIAGNOSIS — E559 Vitamin D deficiency, unspecified: Secondary | ICD-10-CM

## 2014-11-21 MED ORDER — VITAMIN D3 1.25 MG (50000 UT) PO CAPS: 1.0000 | ORAL_CAPSULE | ORAL | Status: DC

## 2014-11-21 NOTE — Telephone Encounter (Signed)
Vitamin D is improved, but still mildly low. I would like to do one more 12 week round of prescribed 50,000 units vitamin D. And then she should be able to go back to an over-the-counter vitamin D supplementation. I have called this into her pharmacy.

## 2014-11-21 NOTE — Telephone Encounter (Signed)
Reviewed lab results and instructions with patient. Patient verbalized understanding. 

## 2014-11-27 ENCOUNTER — Ambulatory Visit: Payer: BC Managed Care – PPO | Admitting: Family Medicine

## 2015-01-22 ENCOUNTER — Telehealth: Payer: Self-pay | Admitting: Family Medicine

## 2015-01-22 NOTE — Telephone Encounter (Signed)
Pt is asking for her RX's to be sent to [facility] going forward.  [facility] is [phone].

## 2015-01-23 NOTE — Telephone Encounter (Signed)
Pharmacy information changed.

## 2015-02-10 ENCOUNTER — Other Ambulatory Visit: Payer: Self-pay | Admitting: *Deleted

## 2015-02-10 DIAGNOSIS — I10 Essential (primary) hypertension: Secondary | ICD-10-CM

## 2015-02-10 DIAGNOSIS — F32A Depression, unspecified: Secondary | ICD-10-CM

## 2015-02-10 DIAGNOSIS — F329 Major depressive disorder, single episode, unspecified: Secondary | ICD-10-CM

## 2015-02-10 MED ORDER — DULOXETINE HCL 60 MG PO CPEP: 60.0000 mg | ORAL_CAPSULE | Freq: Every day | ORAL | Status: DC

## 2015-02-10 MED ORDER — LISINOPRIL 40 MG PO TABS: 40.0000 mg | ORAL_TABLET | Freq: Every day | ORAL | Status: DC

## 2015-02-12 ENCOUNTER — Other Ambulatory Visit: Payer: Self-pay | Admitting: Family Medicine

## 2015-02-12 DIAGNOSIS — F32A Depression, unspecified: Secondary | ICD-10-CM

## 2015-02-12 DIAGNOSIS — F329 Major depressive disorder, single episode, unspecified: Secondary | ICD-10-CM

## 2015-02-12 NOTE — Telephone Encounter (Signed)
Refill request for pt's buproprion to go to [facility] # 90 days.  Please advise.

## 2015-02-13 MED ORDER — BUPROPION HCL ER (XL) 150 MG PO TB24: 150.0000 mg | ORAL_TABLET | Freq: Every day | ORAL | Status: DC

## 2015-04-30 ENCOUNTER — Other Ambulatory Visit: Payer: Self-pay | Admitting: Family Medicine

## 2015-07-10 ENCOUNTER — Ambulatory Visit (HOSPITAL_BASED_OUTPATIENT_CLINIC_OR_DEPARTMENT_OTHER)
Admission: RE | Admit: 2015-07-10 | Discharge: 2015-07-10 | Disposition: A | Discharge status: Home / Self Care | Payer: BC Managed Care – PPO | Source: Ambulatory Visit | Attending: Family Medicine | Admitting: Family Medicine

## 2015-07-10 ENCOUNTER — Telehealth: Payer: Self-pay | Admitting: Family Medicine

## 2015-07-10 ENCOUNTER — Encounter: Payer: Self-pay | Admitting: Family Medicine

## 2015-07-10 ENCOUNTER — Ambulatory Visit (INDEPENDENT_AMBULATORY_CARE_PROVIDER_SITE_OTHER): Payer: BC Managed Care – PPO | Admitting: Family Medicine

## 2015-07-10 VITALS — BP 125/80 | HR 94 | Temp 98.3°F | Resp 20 | Wt 213.8 lb

## 2015-07-10 DIAGNOSIS — S0592XA Unspecified injury of left eye and orbit, initial encounter: Secondary | ICD-10-CM

## 2015-07-10 DIAGNOSIS — S0590XA Unspecified injury of unspecified eye and orbit, initial encounter: Secondary | ICD-10-CM | POA: Insufficient documentation

## 2015-07-10 DIAGNOSIS — S0012XA Contusion of left eyelid and periocular area, initial encounter: Secondary | ICD-10-CM | POA: Diagnosis not present

## 2015-07-10 DIAGNOSIS — X58XXXA Exposure to other specified factors, initial encounter: Secondary | ICD-10-CM | POA: Insufficient documentation

## 2015-07-10 NOTE — Progress Notes (Signed)
Patient ID: Diana Rios, female   DOB: [birth year], [age] y.o.   MRN: [id number]    Diana Rios , [birth year], 60 y.o., female MRN: [id number]  CC: left eye trauma  Subjective:  Patient presents for an acute office visit after sustaining a left eye trauma this morning. She states her 61 year old grandson was sitting on her lap when he threw his head back and hit her in the eye with the back of his head. She reports the area immediately started to bruise and swell. She states that she put ice on it for just a couple minutes only. She has not taken any medications for pain. She denies any headaches, orbital pressure, blurry vision, double vision or photophobia. She denies any eye redness, irritation or pain at the eyeball. She reports she does take a daily baby aspirin. She feels the area has become progressively purple and swollen over the last few hours. She states her daughter made her come to this appointment to get checked.  Allergies  Allergen Reactions  . Codeine Itching  . Penicillins Itching   Social History  Substance Use Topics  . Smoking status: Never Smoker   . Smokeless tobacco: Not on file  . Alcohol Use: No   Past Medical History  Diagnosis Date  . Depression   . Hypertension   . Chicken pox    History reviewed. No pertinent past surgical history. Family History  Problem Relation Age of Onset  . Heart disease Mother [id number]    premature  . Diabetes Mother   . Hypertension Father   . Heart disease Sister     heart defect  . Diabetes Daughter   . Cancer Paternal Grandmother      Medication List       This list is accurate as of: 07/10/15  4:32 PM.  Always use your most recent med list.               aspirin 81 MG tablet  Take 81 mg by mouth daily.     buPROPion 150 MG 24 hr tablet  Commonly known as:  WELLBUTRIN XL  Take 1 tablet (150 mg total) by mouth daily.     DULoxetine 60 MG capsule  Commonly known as:  CYMBALTA  Take 1 capsule (60 mg total) by  mouth daily.     lisinopril 40 MG tablet  Commonly known as:  PRINIVIL,ZESTRIL  TAKE 1 TABLET DAILY     Vitamin D3 50000 units Caps  Take 1 capsule by mouth every 7 (seven) days.     zoster vaccine live (PF) 19400 UNT/0.65ML injection  Commonly known as:  ZOSTAVAX  Inject 19,400 Units into the skin once.       ROS: Negative, with the exception of above mentioned in HPI  Objective:  BP 125/80 mmHg  Pulse 94  Temp(Src) 98.3 F (36.8 C)  Resp 20  Wt 213 lb 12.8 oz (96.979 kg)  SpO2 96% Body mass index is 34.52 kg/(m^2). Gen: Afebrile. No acute distress. Nontoxic in appearance, well-developed, well-nourished, Caucasian female.Marland Kitchen. Pleasant. HENT: Deep purple bruising/moderate swelling left upper eyelid. Mild tenderness to palpitation soft tissue. No bony tenderness on palpation. Palpable mass? Hematoma left upper eyelid (quarter size). Bilateral TM visualized and normal in appearance. Midline septum. Eyes:Pupils Equal Round Reactive to light, Extraocular movements intact,  Conjunctiva without redness, discharge or icterus. CV: RRR Chest: CTAB, no wheeze or crackles. Good air movement, normal resp effort.   Neuro:Normal gait. PERLA. EOMi. Alert. Oriented  x3 Cranial nerves II through XII intact. Muscle strength 5/5 upper/lower extremity. DTRs equal bilaterally.  Assessment/Plan: Diana Rios is a 60 y.o. female present for acute OV for after sustaining an eye trauma today. 1. Eye trauma, left, initial encounter - Discussed with patient and her daughter today imaging evaluation to make certain she has not fractured her orbit. Patient has  palpable hematoma left upper eyelid. She denies any visual changes currently. Discussed percussions and symptoms to seek emergent care with her today. - Avoid all NSAIDs. Patient was offered tramadol for pain and declined. - Ice the area with caution, never apply ice directly to skin, 5 minutes on 5 minutes off. - CT Maxillofacial WO CM; Future - CT  OrbitsS W/O CM; Future - Patient will be called with results once they become available, if any of the precautionary symptoms developed over the weekend before scanning, she is to be seen immediately. > 25 minutes spent with patient, >50% of time spent face to face counseling patient and coordinating care.  electronically signed by:  Felix Pacini, DO  [facility] - [place]

## 2015-07-10 NOTE — Telephone Encounter (Signed)
Discussed CT results with patient this evening. Negative for fractures. Moderate size upper eyelid hematoma. - Follow-up as needed, unless worsening symptoms that were reviewed occur.

## 2015-07-29 ENCOUNTER — Telehealth: Payer: Self-pay | Admitting: Family Medicine

## 2015-07-29 ENCOUNTER — Other Ambulatory Visit: Payer: Self-pay | Admitting: Family Medicine

## 2015-07-29 NOTE — Telephone Encounter (Signed)
I received a refill request for patients Cymbalta. Refilled for 30 days, patient is an appointment for future refills. Patient was last seen for her depression and hypertension in October 2016. She needs to follow-up every 6 months for these chronic issues, therefore she needs to make an appointment at her earliest convenience for hypertension and depression.  - Please call patient and encouraged her to make an appointment.

## 2015-07-29 NOTE — Telephone Encounter (Signed)
Spoke with patient reviewed information and instructions. Patient verbalized understanding. 

## 2015-08-28 ENCOUNTER — Other Ambulatory Visit: Payer: Self-pay | Admitting: Family Medicine

## 2015-08-28 ENCOUNTER — Telehealth: Payer: Self-pay | Admitting: *Deleted

## 2015-08-28 DIAGNOSIS — F329 Major depressive disorder, single episode, unspecified: Secondary | ICD-10-CM

## 2015-08-28 DIAGNOSIS — F32A Depression, unspecified: Secondary | ICD-10-CM

## 2015-08-28 NOTE — Telephone Encounter (Signed)
Refilled patient duloxetine for 14 day supply. Patient needs office visit prior to anymore refills. Spoke with patient scheduled appt for 09/08/15.

## 2015-09-08 ENCOUNTER — Ambulatory Visit: Payer: BC Managed Care – PPO | Admitting: Family Medicine

## 2015-09-22 ENCOUNTER — Ambulatory Visit (INDEPENDENT_AMBULATORY_CARE_PROVIDER_SITE_OTHER): Payer: BC Managed Care – PPO | Admitting: Family Medicine

## 2015-09-22 ENCOUNTER — Other Ambulatory Visit: Payer: Self-pay | Admitting: Family Medicine

## 2015-09-22 ENCOUNTER — Encounter: Payer: Self-pay | Admitting: Family Medicine

## 2015-09-22 VITALS — BP 121/78 | HR 74 | Temp 95.0°F | Resp 20 | Ht 66.0 in | Wt 213.2 lb

## 2015-09-22 DIAGNOSIS — E559 Vitamin D deficiency, unspecified: Secondary | ICD-10-CM

## 2015-09-22 DIAGNOSIS — Z13220 Encounter for screening for lipoid disorders: Secondary | ICD-10-CM

## 2015-09-22 DIAGNOSIS — F418 Other specified anxiety disorders: Secondary | ICD-10-CM | POA: Diagnosis not present

## 2015-09-22 DIAGNOSIS — Z1329 Encounter for screening for other suspected endocrine disorder: Secondary | ICD-10-CM

## 2015-09-22 DIAGNOSIS — Z130 Encounter for screening for diseases of the blood and blood-forming organs and certain disorders involving the immune mechanism: Secondary | ICD-10-CM

## 2015-09-22 DIAGNOSIS — Z136 Encounter for screening for cardiovascular disorders: Secondary | ICD-10-CM

## 2015-09-22 DIAGNOSIS — I10 Essential (primary) hypertension: Secondary | ICD-10-CM

## 2015-09-22 DIAGNOSIS — F329 Major depressive disorder, single episode, unspecified: Secondary | ICD-10-CM | POA: Insufficient documentation

## 2015-09-22 DIAGNOSIS — R7303 Prediabetes: Secondary | ICD-10-CM

## 2015-09-22 DIAGNOSIS — F32A Depression, unspecified: Secondary | ICD-10-CM | POA: Insufficient documentation

## 2015-09-22 DIAGNOSIS — Z1231 Encounter for screening mammogram for malignant neoplasm of breast: Secondary | ICD-10-CM

## 2015-09-22 DIAGNOSIS — Z6834 Body mass index (BMI) 34.0-34.9, adult: Secondary | ICD-10-CM

## 2015-09-22 MED ORDER — BUPROPION HCL ER (XL) 300 MG PO TB24: 300.0000 mg | ORAL_TABLET | Freq: Every day | ORAL | 2 refills | Status: DC

## 2015-09-22 MED ORDER — DULOXETINE HCL 60 MG PO CPEP: 60.0000 mg | ORAL_CAPSULE | Freq: Every day | ORAL | 1 refills | Status: DC

## 2015-09-22 MED ORDER — LISINOPRIL 40 MG PO TABS: 40.0000 mg | ORAL_TABLET | Freq: Every day | ORAL | 1 refills | Status: DC

## 2015-09-22 NOTE — Patient Instructions (Signed)
Start with exercising  150 minutes a week. Work up to the 150 minutes a week by the time you see me in 4 weeks.  Cut back on sugars, sodas, desserts and fried high fat food. Make this a part of you routine, and we will work out more details on next visit.  I have called in the increase dose of Wellbutrin (300 mg daily). Take 2 of the 150 mg pills a day until new dose arrives.  Make an appt to have fasting labs drawn 3 days prior to your appt with me in 4 weeks.   Calorie Counting for Weight Loss Calories are energy you get from the things you eat and drink. Your body uses this energy to keep you going throughout the day. The number of calories you eat affects your weight. When you eat more calories than your body needs, your body stores the extra calories as fat. When you eat fewer calories than your body needs, your body burns fat to get the energy it needs. Calorie counting means keeping track of how many calories you eat and drink each day. If you make sure to eat fewer calories than your body needs, you should lose weight. In order for calorie counting to work, you will need to eat the number of calories that are right for you in a day to lose a healthy amount of weight per week. A healthy amount of weight to lose per week is usually 1-2 lb (0.5-0.9 kg). A dietitian can determine how many calories you need in a day and give you suggestions on how to reach your calorie goal.  WHAT IS MY MY PLAN? My goal is to have __________ calories per day.  If I have this many calories per day, I should lose around __________ pounds per week. WHAT DO I NEED TO KNOW ABOUT CALORIE COUNTING? In order to meet your daily calorie goal, you will need to:  Find out how many calories are in each food you would like to eat. Try to do this before you eat.  Decide how much of the food you can eat.  Write down what you ate and how many calories it had. Doing this is called keeping a food log. WHERE DO I FIND CALORIE  INFORMATION? The number of calories in a food can be found on a Nutrition Facts label. Note that all the information on a label is based on a specific serving of the food. If a food does not have a Nutrition Facts label, try to look up the calories online or ask your dietitian for help. HOW DO I DECIDE HOW MUCH TO EAT? To decide how much of the food you can eat, you will need to consider both the number of calories in one serving and the size of one serving. This information can be found on the Nutrition Facts label. If a food does not have a Nutrition Facts label, look up the information online or ask your dietitian for help. Remember that calories are listed per serving. If you choose to have more than one serving of a food, you will have to multiply the calories per serving by the amount of servings you plan to eat. For example, the label on a package of bread might say that a serving size is 1 slice and that there are 90 calories in a serving. If you eat 1 slice, you will have eaten 90 calories. If you eat 2 slices, you will have eaten 180 calories. HOW  DO I KEEP A FOOD LOG? After each meal, record the following information in your food log:  What you ate.  How much of it you ate.  How many calories it had.  Then, add up your calories. Keep your food log near you, such as in a small notebook in your pocket. Another option is to use a mobile app or website. Some programs will calculate calories for you and show you how many calories you have left each time you add an item to the log. WHAT ARE SOME CALORIE COUNTING TIPS?  Use your calories on foods and drinks that will fill you up and not leave you hungry. Some examples of this include foods like nuts and nut butters, vegetables, lean proteins, and high-fiber foods (more than 5 g fiber per serving).  Eat nutritious foods and avoid empty calories. Empty calories are calories you get from foods or beverages that do not have many nutrients, such  as candy and soda. It is better to have a nutritious high-calorie food (such as an avocado) than a food with few nutrients (such as a bag of chips).  Know how many calories are in the foods you eat most often. This way, you do not have to look up how many calories they have each time you eat them.  Look out for foods that may seem like low-calorie foods but are really high-calorie foods, such as baked goods, soda, and fat-free candy.  Pay attention to calories in drinks. Drinks such as sodas, specialty coffee drinks, alcohol, and juices have a lot of calories yet do not fill you up. Choose low-calorie drinks like water and diet drinks.  Focus your calorie counting efforts on higher calorie items. Logging the calories in a garden salad that contains only vegetables is less important than calculating the calories in a milk shake.  Find a way of tracking calories that works for you. Get creative. Most people who are successful find ways to keep track of how much they eat in a day, even if they do not count every calorie. WHAT ARE SOME PORTION CONTROL TIPS?  Know how many calories are in a serving. This will help you know how many servings of a certain food you can have.  Use a measuring cup to measure serving sizes. This is helpful when you start out. With time, you will be able to estimate serving sizes for some foods.  Take some time to put servings of different foods on your favorite plates, bowls, and cups so you know what a serving looks like.  Try not to eat straight from a bag or box. Doing this can lead to overeating. Put the amount you would like to eat in a cup or on a plate to make sure you are eating the right portion.  Use smaller plates, glasses, and bowls to prevent overeating. This is a quick and easy way to practice portion control. If your plate is smaller, less food can fit on it.  Try not to multitask while eating, such as watching TV or using your computer. If it is time to  eat, sit down at a table and enjoy your food. Doing this will help you to start recognizing when you are full. It will also make you more aware of what and how much you are eating. HOW CAN I CALORIE COUNT WHEN EATING OUT?  Ask for smaller portion sizes or child-sized portions.  Consider sharing an entree and sides instead of getting your own  entree.  If you get your own entree, eat only half. Ask for a box at the beginning of your meal and put the rest of your entree in it so you are not tempted to eat it.  Look for the calories on the menu. If calories are listed, choose the lower calorie options.  Choose dishes that include vegetables, fruits, whole grains, low-fat dairy products, and lean protein. Focusing on smart food choices from each of the 5 food groups can help you stay on track at restaurants.  Choose items that are boiled, broiled, grilled, or steamed.  Choose water, milk, unsweetened iced tea, or other drinks without added sugars. If you want an alcoholic beverage, choose a lower calorie option. For example, a regular margarita can have up to 700 calories and a glass of wine has around 150.  Stay away from items that are buttered, battered, fried, or served with cream sauce. Items labeled "crispy" are usually fried, unless stated otherwise.  Ask for dressings, sauces, and syrups on the side. These are usually very high in calories, so do not eat much of them.  Watch out for salads. Many people think salads are a healthy option, but this is often not the case. Many salads come with bacon, fried chicken, lots of cheese, fried chips, and dressing. All of these items have a lot of calories. If you want a salad, choose a garden salad and ask for grilled meats or steak. Ask for the dressing on the side, or ask for olive oil and vinegar or lemon to use as dressing.  Estimate how many servings of a food you are given. For example, a serving of cooked rice is  cup or about the size of half  a tennis ball or one cupcake wrapper. Knowing serving sizes will help you be aware of how much food you are eating at restaurants. The list below tells you how big or small some common portion sizes are based on everyday objects.  1 oz--4 stacked dice.  3 oz--1 deck of cards.  1 tsp--1 dice.  1 Tbsp-- a Ping-Pong ball.  2 Tbsp--1 Ping-Pong ball.   cup--1 tennis ball or 1 cupcake wrapper.  1 cup--1 baseball.   This information is not intended to replace advice given to you by your health care provider. Make sure you discuss any questions you have with your health care provider.   Document Released: 01/17/2005 Document Revised: 02/07/2014 Document Reviewed: 11/22/2012 Elsevier Interactive Patient Education [employer]2016 Elsevier Inc.

## 2015-09-22 NOTE — Progress Notes (Signed)
Subjective:    Patient ID: Diana Rios, female    DOB: [birth year], 60 y.o.   MRN: [id number]  HPI  Essential hypertension, benign: Patient reports compliance with her lisinopril 40 mg daily daily. She attempts to watch the salt content in her diet. She does not exercise frequently and admits to weight gain. She states her weight gain is so significant her family has started to suggest dietary changes. She does not take her blood pressure in the outpatient setting. She denies any chest pain, shortness of breath, lower leg edema, headaches or visual changes.  Vitamin D deficiency: Patient had a low vitamin D without proper follow-up after prescribed supplementation. She endorses currently taking 800 u daily of OTC vit d.   Depression with anxiety: Patient states she has been on Wellbutrin and Cymbalta for "quite some time ". She has struggled with depression and anxiety the majority of her life. She has not seen a psychiatrist or been admitted for her depression/anxiety. She has always been well-controlled with the above medications. Patient states she currently feels more anxious. She is having a difficult time with her father's health. He has been diagnosed with alzheimer's dementia and is having a steady cognitive decline. He is in the care of his wife, that Diana Rios feels is also having some memory loss and that is causing more issues with her father's care.    Past Medical History:  Diagnosis Date  . Chicken pox   . Depression   . Hypertension    Allergies  Allergen Reactions  . Codeine Itching  . Penicillins Itching   No past surgical history on file. Family History  Problem Relation Age of Onset  . Heart disease Mother [id number]    premature  . Diabetes Mother   . Hypertension Father   . Heart disease Sister     heart defect  . Diabetes Daughter   . Cancer Paternal Grandmother    Social History   Social History  . Marital status: Married    Spouse name: N/A  . Number of  children: 2  . Years of education: [employer]   Occupational History  . Geologist, engineeringTeacher Assistant     Retired   Social History Main Topics  . Smoking status: Never Smoker  . Smokeless tobacco: Not on file  . Alcohol use No  . Drug use: No  . Sexual activity: Yes    Birth control/ protection: Post-menopausal   Other Topics Concern  . Not on file   Social History Narrative   [name] [place] from [place], [place]. She lived in [place], [place] for short while. 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    Review of Systems  Psychiatric/Behavioral: Positive for depression.   Negative, with the exception of above mentioned in HPI     Objective:   Physical Exam BP 121/78 (BP Location: Left Arm, Patient Position: Sitting, Cuff Size: Large)   Pulse 74   Temp (!) 95 F (35 C) (Oral)   Resp 20   Ht 5\' 6"  (1.676 m)   Wt 213 lb 4 oz (96.7 kg)   SpO2 95%   BMI 34.42 kg/m   Body mass index is 34.42 kg/m. Gen: Afebrile. No acute distress. Nontoxic in appearance, well-developed, well-nourished female. Pleasant. HENT: AT. [place].MMM.  Eyes:Pupils Equal Round Reactive to light, Extraocular movements intact,  Conjunctiva without redness, discharge or icterus. Neck/lymp/endocrine: Supple, no lymphadenopathy CV: RRR, no edema Chest: CTAB, no wheeze or crackles Abd: Soft. Round.  NTND. BS present. Neuro: Normal gait. PERLA. EOMi. Alert. Oriented x3  Psych: Normal affect, dress and demeanor. Normal speech. Normal thought content and judgment. Mildly more anxious.     Assessment & Plan:  Essential hypertension, benign - Stable today. - low salt diet. Exercise encouraged - refills on lisinopril - CMP ; future - lisinopril (PRINIVIL,ZESTRIL) 40 MG tablet; Take 1 tablet (40 mg total) by mouth daily.  Dispense: 30 tablet; Refill: 3  BMI >30: - discussed attempting to work her way up to 150 minutes of exercise a week.  - cut back on sugar, desserts, soda, fried foods. Drink more  water.  -  I encouraged her to keep the initial steps to weight loss simple and make it her lifestyle.  - She is overdue for her CPE. Discussed making the above lifestyle changes over the next 3-4 weeks. Follow up with CPE at that time with fasting labs prior (order placed).  - Depending on those results and her success on lifestyle changes will instruct her on next steps to making exercise/diet a lifestyle and not a crash diet.  - A1c, TSH, Lipids; future  Vitamin D deficiency - No repeat after prescribed supplement. Continue 800 units of vitamin D daily. - Vitamin D (25 hydroxy); Future   Depression with anxiety - Not controlled. > 25 minutes spent discussing.  - Increase Wellbutrin to 300 mg daily. Continue cymbalta 60 mg. Refills on both provided today.  - pt to have CPE in 3-4 weeks. If not doing better at that time with increase in Wellbutrin, would consider add on or change in therapy.  - buPROPion (WELLBUTRIN XL) 150 MG 24 hr tablet; Take 1 tablet (150 mg total) by mouth daily.  Dispense: 30 tablet; Refill: 3--> increase to 300 mg - DULoxetine (CYMBALTA) 60 MG capsule; Take 1 capsule (60 mg total) by mouth daily.  Dispense: 30 capsule; Refill: 3   Greater than 40 minutes spent with patient, >50% of time spent face to face counseling patient and coordinating care.   Electronically Signed by: Felix Pacinienee Sinai Mahany, DO [facility]- [place]

## 2015-09-23 ENCOUNTER — Other Ambulatory Visit: Payer: Self-pay | Admitting: *Deleted

## 2015-09-23 DIAGNOSIS — F418 Other specified anxiety disorders: Secondary | ICD-10-CM

## 2015-09-23 MED ORDER — BUPROPION HCL ER (XL) 300 MG PO TB24: 300.0000 mg | ORAL_TABLET | Freq: Every day | ORAL | 0 refills | Status: DC

## 2015-09-23 NOTE — Telephone Encounter (Signed)
90 day supply of wellbutrin sent to pharmacy

## 2015-09-24 ENCOUNTER — Ambulatory Visit (HOSPITAL_BASED_OUTPATIENT_CLINIC_OR_DEPARTMENT_OTHER)
Admission: RE | Admit: 2015-09-24 | Discharge: 2015-09-24 | Disposition: A | Discharge status: Home / Self Care | Payer: BC Managed Care – PPO | Source: Ambulatory Visit | Attending: Family Medicine | Admitting: Family Medicine

## 2015-09-24 DIAGNOSIS — Z1231 Encounter for screening mammogram for malignant neoplasm of breast: Secondary | ICD-10-CM | POA: Diagnosis present

## 2015-10-16 ENCOUNTER — Other Ambulatory Visit: Payer: BC Managed Care – PPO

## 2015-10-21 ENCOUNTER — Ambulatory Visit: Payer: BC Managed Care – PPO | Admitting: Family Medicine

## 2015-10-28 ENCOUNTER — Other Ambulatory Visit: Payer: BC Managed Care – PPO

## 2015-10-30 ENCOUNTER — Ambulatory Visit: Payer: BC Managed Care – PPO | Admitting: Family Medicine

## 2015-11-27 ENCOUNTER — Other Ambulatory Visit (INDEPENDENT_AMBULATORY_CARE_PROVIDER_SITE_OTHER): Payer: BC Managed Care – PPO

## 2015-11-27 DIAGNOSIS — Z136 Encounter for screening for cardiovascular disorders: Secondary | ICD-10-CM | POA: Diagnosis not present

## 2015-11-27 DIAGNOSIS — I10 Essential (primary) hypertension: Secondary | ICD-10-CM | POA: Diagnosis not present

## 2015-11-27 DIAGNOSIS — Z13220 Encounter for screening for lipoid disorders: Secondary | ICD-10-CM | POA: Diagnosis not present

## 2015-11-27 DIAGNOSIS — R7303 Prediabetes: Secondary | ICD-10-CM

## 2015-11-27 DIAGNOSIS — Z1329 Encounter for screening for other suspected endocrine disorder: Secondary | ICD-10-CM

## 2015-11-27 DIAGNOSIS — E559 Vitamin D deficiency, unspecified: Secondary | ICD-10-CM | POA: Diagnosis not present

## 2015-11-27 DIAGNOSIS — Z130 Encounter for screening for diseases of the blood and blood-forming organs and certain disorders involving the immune mechanism: Secondary | ICD-10-CM

## 2015-11-27 LAB — COMPREHENSIVE METABOLIC PANEL
ALBUMIN: 4.1 g/dL (ref 3.5–5.2)
ALK PHOS: 79 U/L (ref 39–117)
ALT: 17 U/L (ref 0–35)
AST: 18 U/L (ref 0–37)
BUN: 20 mg/dL (ref 6–23)
CO2: 26 mEq/L (ref 19–32)
Calcium: 9.2 mg/dL (ref 8.4–10.5)
Chloride: 106 mEq/L (ref 96–112)
Creatinine, Ser: 0.93 mg/dL (ref 0.40–1.20)
GFR: 64.99 mL/min (ref >60.00)
Glucose, Bld: 136 mg/dL — ABNORMAL HIGH (ref 70–99)
POTASSIUM: 4 meq/L (ref 3.5–5.1)
Sodium: 139 mEq/L (ref 135–145)
TOTAL PROTEIN: 6.9 g/dL (ref 6.0–8.3)
Total Bilirubin: 0.5 mg/dL (ref 0.2–1.2)

## 2015-11-27 LAB — LIPID PANEL
CHOLESTEROL: 199 mg/dL (ref 0–200)
HDL: 48.5 mg/dL (ref >39.00)
LDL CALC: 127 mg/dL — AB (ref 0–99)
NonHDL: 150.76
TRIGLYCERIDES: 120 mg/dL (ref 0.0–149.0)
Total CHOL/HDL Ratio: 4
VLDL: 24 mg/dL (ref 0.0–40.0)

## 2015-11-27 LAB — CBC WITH DIFFERENTIAL/PLATELET
BASOS ABS: 0 10*3/uL (ref 0.0–0.1)
Basophils Relative: 0.4 % (ref 0.0–3.0)
EOS PCT: 4.1 % (ref 0.0–5.0)
Eosinophils Absolute: 0.2 10*3/uL (ref 0.0–0.7)
HEMATOCRIT: 37 % (ref 36.0–46.0)
Hemoglobin: 12 g/dL (ref 12.0–15.0)
LYMPHS PCT: 40.6 % (ref 12.0–46.0)
Lymphs Abs: 1.8 10*3/uL (ref 0.7–4.0)
MCHC: 32.4 g/dL (ref 30.0–36.0)
MCV: 79.2 fl (ref 78.0–100.0)
MONOS PCT: 5.2 % (ref 3.0–12.0)
Monocytes Absolute: 0.2 10*3/uL (ref 0.1–1.0)
NEUTROS ABS: 2.2 10*3/uL (ref 1.4–7.7)
Neutrophils Relative %: 49.7 % (ref 43.0–77.0)
PLATELETS: 275 10*3/uL (ref 150.0–400.0)
RBC: 4.67 Mil/uL (ref 3.87–5.11)
RDW: 15.9 % — ABNORMAL HIGH (ref 11.5–15.5)
WBC: 4.4 10*3/uL (ref 4.0–10.5)

## 2015-11-27 LAB — HEMOGLOBIN A1C: Hgb A1c MFr Bld: 6.2 % (ref 4.6–6.5)

## 2015-11-30 ENCOUNTER — Other Ambulatory Visit: Payer: BC Managed Care – PPO

## 2015-11-30 LAB — TSH: TSH: 1.58 u[IU]/mL (ref 0.35–4.50)

## 2015-11-30 LAB — VITAMIN D 25 HYDROXY (VIT D DEFICIENCY, FRACTURES): VITD: 23.85 ng/mL — AB (ref 30.00–100.00)

## 2015-12-02 ENCOUNTER — Encounter: Payer: Self-pay | Admitting: Family Medicine

## 2015-12-02 ENCOUNTER — Ambulatory Visit (INDEPENDENT_AMBULATORY_CARE_PROVIDER_SITE_OTHER): Payer: BC Managed Care – PPO | Admitting: Family Medicine

## 2015-12-02 VITALS — BP 141/80 | HR 80 | Temp 98.5°F | Resp 20 | Ht 66.0 in | Wt 208.8 lb

## 2015-12-02 DIAGNOSIS — Z6833 Body mass index (BMI) 33.0-33.9, adult: Secondary | ICD-10-CM

## 2015-12-02 DIAGNOSIS — E669 Obesity, unspecified: Secondary | ICD-10-CM | POA: Insufficient documentation

## 2015-12-02 DIAGNOSIS — F32A Depression, unspecified: Secondary | ICD-10-CM

## 2015-12-02 DIAGNOSIS — F329 Major depressive disorder, single episode, unspecified: Secondary | ICD-10-CM

## 2015-12-02 DIAGNOSIS — Z0001 Encounter for general adult medical examination with abnormal findings: Secondary | ICD-10-CM

## 2015-12-02 DIAGNOSIS — E2839 Other primary ovarian failure: Secondary | ICD-10-CM

## 2015-12-02 DIAGNOSIS — E119 Type 2 diabetes mellitus without complications: Secondary | ICD-10-CM

## 2015-12-02 DIAGNOSIS — E559 Vitamin D deficiency, unspecified: Secondary | ICD-10-CM | POA: Diagnosis not present

## 2015-12-02 DIAGNOSIS — Z1211 Encounter for screening for malignant neoplasm of colon: Secondary | ICD-10-CM

## 2015-12-02 DIAGNOSIS — I10 Essential (primary) hypertension: Secondary | ICD-10-CM

## 2015-12-02 DIAGNOSIS — Z1159 Encounter for screening for other viral diseases: Secondary | ICD-10-CM

## 2015-12-02 MED ORDER — METFORMIN HCL 500 MG PO TABS: ORAL_TABLET | ORAL | 0 refills | Status: DC

## 2015-12-02 MED ORDER — VITAMIN D (ERGOCALCIFEROL) 1.25 MG (50000 UNIT) PO CAPS: 50000.0000 [IU] | ORAL_CAPSULE | ORAL | 0 refills | Status: DC

## 2015-12-02 NOTE — Patient Instructions (Addendum)
Basic Carbohydrate Counting for Diabetes Mellitus Carbohydrate counting is a method for keeping track of the amount of carbohydrates you eat. Eating carbohydrates naturally increases the level of sugar (glucose) in your blood, so it is important for you to know the amount that is okay for you to have in every meal. Carbohydrate counting helps keep the level of glucose in your blood within normal limits. The amount of carbohydrates allowed is different for every person. A dietitian can help you calculate the amount that is right for you. Once you know the amount of carbohydrates you can have, you can count the carbohydrates in the foods you want to eat. Carbohydrates are found in the following foods:  Grains, such as breads and cereals.  Dried beans and soy products.  Starchy vegetables, such as potatoes, peas, and corn.  Fruit and fruit juices.  Milk and yogurt.  Sweets and snack foods, such as cake, cookies, candy, chips, soft drinks, and fruit drinks. CARBOHYDRATE COUNTING There are two ways to count the carbohydrates in your food. You can use either of the methods or a combination of both. Reading the "Nutrition Facts" on [place] The "Nutrition Facts" is an area that is included on the labels of almost all packaged food and beverages in the [place]. It includes the serving size of that food or beverage and information about the nutrients in each serving of the food, including the grams (g) of carbohydrate per serving.  Decide the number of servings of this food or beverage that you will be able to eat or drink. Multiply that number of servings by the number of grams of carbohydrate that is listed on the label for that serving. The total will be the amount of carbohydrates you will be having when you eat or drink this food or beverage. Learning Standard Serving Sizes of Food When you eat food that is not packaged or does not include "Nutrition Facts" on the label, you need to  measure the servings in order to count the amount of carbohydrates.A serving of most carbohydrate-rich foods contains about 15 g of carbohydrates. The following list includes serving sizes of carbohydrate-rich foods that provide 15 g ofcarbohydrate per serving:   1 slice of bread (1 oz) or 1 six-inch tortilla.    of a hamburger bun or English muffin.  4-6 crackers.   cup unsweetened dry cereal.    cup hot cereal.   cup rice or pasta.    cup mashed potatoes or  of a large baked potato.  1 cup fresh fruit or one small piece of fruit.    cup canned or frozen fruit or fruit juice.  1 cup milk.   cup plain fat-free yogurt or yogurt sweetened with artificial sweeteners.   cup cooked dried beans or starchy vegetable, such as peas, corn, or potatoes.  Decide the number of standard-size servings that you will eat. Multiply that number of servings by 15 (the grams of carbohydrates in that serving). For example, if you eat 2 cups of strawberries, you will have eaten 2 servings and 30 g of carbohydrates (2 servings x 15 g = 30 g). For foods such as soups and casseroles, in which more than one food is mixed in, you will need to count the carbohydrates in each food that is included. EXAMPLE OF CARBOHYDRATE COUNTING Sample Dinner  3 oz chicken breast.   cup of brown rice.   cup of corn.  1 cup milk.   1 cup strawberries with  sugar-free whipped topping.  Carbohydrate Calculation Step 1: Identify the foods that contain carbohydrates:   Rice.   Corn.   Milk.   Strawberries. Step 2:Calculate the number of servings eaten of each:   2 servings of rice.   1 serving of corn.   1 serving of milk.   1 serving of strawberries. Step 3: Multiply each of those number of servings by 15 g:   2 servings of rice x 15 g = 30 g.   1 serving of corn x 15 g = 15 g.   1 serving of milk x 15 g = 15 g.   1 serving of strawberries x 15 g = 15 g. Step 4: Add  together all of the amounts to find the total grams of carbohydrates eaten: 30 g + 15 g + 15 g + 15 g = 75 g.   This information is not intended to replace advice given to you by your health care provider. Make sure you discuss any questions you have with your health care provider.   Document Released: 01/17/2005 Document Revised: 02/07/2014 Document Reviewed: 12/14/2012 Elsevier Interactive Patient Education 2016 [place] Maintenance, Female Adopting a healthy lifestyle and getting preventive care can go a long way to promote health and wellness. Talk with your health care provider about what schedule of regular examinations is right for you. This is a good chance for you to check in with your provider about disease prevention and staying healthy. In between checkups, there are plenty of things you can do on your own. Experts have done a lot of research about which lifestyle changes and preventive measures are most likely to keep you healthy. Ask your health care provider for more information. WEIGHT AND DIET  Eat a healthy diet  Be sure to include plenty of vegetables, fruits, low-fat dairy products, and lean protein.  Do not eat a lot of foods high in solid fats, added sugars, or salt.  Get regular exercise. This is one of the most important things you can do for your health.  Most adults should exercise for at least 150 minutes each week. The exercise should increase your heart rate and make you sweat (moderate-intensity exercise).  Most adults should also do strengthening exercises at least twice a week. This is in addition to the moderate-intensity exercise.  Maintain a healthy weight  Body mass index (BMI) is a measurement that can be used to identify possible weight problems. It estimates body fat based on height and weight. Your health care provider can help determine your BMI and help you achieve or maintain a healthy weight.  For females 61 years of age and older:    A BMI below 18.5 is considered underweight.  A BMI of 18.5 to 24.9 is normal.  A BMI of 25 to 29.9 is considered overweight.  A BMI of 30 and above is considered obese.  Watch levels of cholesterol and blood lipids  You should start having your blood tested for lipids and cholesterol at 61 years of age, then have this test every 5 years.  You may need to have your cholesterol levels checked more often if:  Your lipid or cholesterol levels are high.  You are older than 61 years of age.  You are at high risk for heart disease.  CANCER SCREENING   Lung Cancer  Lung cancer screening is recommended for adults 42-38 years old who are at high risk for lung cancer because of a history  of smoking.  A yearly low-dose CT scan of the lungs is recommended for people who:  Currently smoke.  Have quit within the past 15 years.  Have at least a 30-pack-year history of smoking. A pack year is smoking an average of one pack of cigarettes a day for 1 year.  Yearly screening should continue until it has been 15 years since you quit.  Yearly screening should stop if you develop a health problem that would prevent you from having lung cancer treatment.  Breast Cancer  Practice breast self-awareness. This means understanding how your breasts normally appear and feel.  It also means doing regular breast self-exams. Let your health care provider know about any changes, no matter how small.  If you are in your 20s or 30s, you should have a clinical breast exam (CBE) by a health care provider every 1-3 years as part of a regular health exam.  If you are 47 or older, have a CBE every year. Also consider having a breast X-ray (mammogram) every year.  If you have a family history of breast cancer, talk to your health care provider about genetic screening.  If you are at high risk for breast cancer, talk to your health care provider about having an MRI and a mammogram every year.  Breast  cancer gene (BRCA) assessment is recommended for women who have family members with BRCA-related cancers. BRCA-related cancers include:  Breast.  Ovarian.  Tubal.  Peritoneal cancers.  Results of the assessment will determine the need for genetic counseling and BRCA1 and BRCA2 testing. Cervical Cancer Your health care provider may recommend that you be screened regularly for cancer of the pelvic organs (ovaries, uterus, and vagina). This screening involves a pelvic examination, including checking for microscopic changes to the surface of your cervix (Pap test). You may be encouraged to have this screening done every 3 years, beginning at age 31.  For women ages 13-65, health care providers may recommend pelvic exams and Pap testing every 3 years, or they may recommend the Pap and pelvic exam, combined with testing for human papilloma virus (HPV), every 5 years. Some types of HPV increase your risk of cervical cancer. Testing for HPV may also be done on women of any age with unclear Pap test results.  Other health care providers may not recommend any screening for nonpregnant women who are considered low risk for pelvic cancer and who do not have symptoms. Ask your health care provider if a screening pelvic exam is right for you.  If you have had past treatment for cervical cancer or a condition that could lead to cancer, you need Pap tests and screening for cancer for at least 20 years after your treatment. If Pap tests have been discontinued, your risk factors (such as having a new sexual partner) need to be reassessed to determine if screening should resume. Some women have medical problems that increase the chance of getting cervical cancer. In these cases, your health care provider may recommend more frequent screening and Pap tests. Colorectal Cancer  This type of cancer can be detected and often prevented.  Routine colorectal cancer screening usually begins at 61 years of age and  continues through 61 years of age.  Your health care provider may recommend screening at an earlier age if you have risk factors for colon cancer.  Your health care provider may also recommend using home test kits to check for hidden blood in the stool.  A small camera at the  end of a tube can be used to examine your colon directly (sigmoidoscopy or colonoscopy). This is done to check for the earliest forms of colorectal cancer.  Routine screening usually begins at age 31.  Direct examination of the colon should be repeated every 5-10 years through 61 years of age. However, you may need to be screened more often if early forms of precancerous polyps or small growths are found. Skin Cancer  Check your skin from head to toe regularly.  Tell your health care provider about any new moles or changes in moles, especially if there is a change in a mole's shape or color.  Also tell your health care provider if you have a mole that is larger than the size of a pencil eraser.  Always use sunscreen. Apply sunscreen liberally and repeatedly throughout the day.  Protect yourself by wearing long sleeves, pants, a wide-brimmed hat, and sunglasses whenever you are outside. HEART DISEASE, DIABETES, AND HIGH BLOOD PRESSURE   High blood pressure causes heart disease and increases the risk of stroke. High blood pressure is more likely to develop in:  People who have blood pressure in the high end of the normal range (130-139/85-89 mm Hg).  People who are overweight or obese.  People who are African American.  If you are 4-90 years of age, have your blood pressure checked every 3-5 years. If you are 71 years of age or older, have your blood pressure checked every year. You should have your blood pressure measured twice--once when you are at a hospital or clinic, and once when you are not at a hospital or clinic. Record the average of the two measurements. To check your blood pressure when you are not at  a hospital or clinic, you can use:  An automated blood pressure machine at a pharmacy.  A home blood pressure monitor.  If you are between 31 years and 53 years old, ask your health care provider if you should take aspirin to prevent strokes.  Have regular diabetes screenings. This involves taking a blood sample to check your fasting blood sugar level.  If you are at a normal weight and have a low risk for diabetes, have this test once every three years after 61 years of age.  If you are overweight and have a high risk for diabetes, consider being tested at a younger age or more often. PREVENTING INFECTION  Hepatitis B  If you have a higher risk for hepatitis B, you should be screened for this virus. You are considered at high risk for hepatitis B if:  You were born in a country where hepatitis B is common. Ask your health care provider which countries are considered high risk.  Your parents were born in a high-risk country, and you have not been immunized against hepatitis B (hepatitis B vaccine).  You have HIV or AIDS.  You use needles to inject street drugs.  You live with someone who has hepatitis B.  You have had sex with someone who has hepatitis B.  You get hemodialysis treatment.  You take certain medicines for conditions, including cancer, organ transplantation, and autoimmune conditions. Hepatitis C  Blood testing is recommended for:  Everyone born from [birth year] through 1966.  Anyone with known risk factors for hepatitis C. Sexually transmitted infections (STIs)  You should be screened for sexually transmitted infections (STIs) including gonorrhea and chlamydia if:  You are sexually active and are younger than 61 years of age.  You are older than  61 years of age and your health care provider tells you that you are at risk for this type of infection.  Your sexual activity has changed since you were last screened and you are at an increased risk for chlamydia or  gonorrhea. Ask your health care provider if you are at risk.  If you do not have HIV, but are at risk, it may be recommended that you take a prescription medicine daily to prevent HIV infection. This is called pre-exposure prophylaxis (PrEP). You are considered at risk if:  You are sexually active and do not regularly use condoms or know the HIV status of your partner(s).  You take drugs by injection.  You are sexually active with a partner who has HIV. Talk with your health care provider about whether you are at high risk of being infected with HIV. If you choose to begin PrEP, you should first be tested for HIV. You should then be tested every 3 months for as long as you are taking PrEP.  PREGNANCY   If you are premenopausal and you may become pregnant, ask your health care provider about preconception counseling.  If you may become pregnant, take 400 to 800 micrograms (mcg) of folic acid every day.  If you want to prevent pregnancy, talk to your health care provider about birth control (contraception). OSTEOPOROSIS AND MENOPAUSE   Osteoporosis is a disease in which the bones lose minerals and strength with aging. This can result in serious bone fractures. Your risk for osteoporosis can be identified using a bone density scan.  If you are 77 years of age or older, or if you are at risk for osteoporosis and fractures, ask your health care provider if you should be screened.  Ask your health care provider whether you should take a calcium or vitamin D supplement to lower your risk for osteoporosis.  Menopause may have certain physical symptoms and risks.  Hormone replacement therapy may reduce some of these symptoms and risks. Talk to your health care provider about whether hormone replacement therapy is right for you.  HOME CARE INSTRUCTIONS   Schedule regular health, dental, and eye exams.  Stay current with your immunizations.   Do not use any tobacco products including  cigarettes, chewing tobacco, or electronic cigarettes.  If you are pregnant, do not drink alcohol.  If you are breastfeeding, limit how much and how often you drink alcohol.  Limit alcohol intake to no more than 1 drink per day for nonpregnant women. One drink equals 12 ounces of beer, 5 ounces of wine, or 1 ounces of hard liquor.  Do not use street drugs.  Do not share needles.  Ask your health care provider for help if you need support or information about quitting drugs.  Tell your health care provider if you often feel depressed.  Tell your health care provider if you have ever been abused or do not feel safe at home.   This information is not intended to replace advice given to you by your health care provider. Make sure you discuss any questions you have with your health care provider.   Document Released: 08/02/2010 Document Revised: 02/07/2014 Document Reviewed: 12/19/2012 Elsevier Interactive Patient Education 2016 [employer]  Take 800u vit d (daily)-- and prescribed vit d (once a week) retest in 12 weeks.  Bone density screen will be scheduled.  Diabetes: Start metformin 1 pill a day for a week and then one pill twice a day.  Exercise greater than 150  minutes a week.  Try to cut back on carbohydrates.

## 2015-12-02 NOTE — Progress Notes (Signed)
Patient ID: Diana Rios, female  DOB: Mar 29, 1955, 60 y.o.   MRN: [id number] Patient Care Team    Relationship Specialty Notifications Start End  Ma Hillock, DO PCP - General Family Medicine  11/20/14     Subjective:  Diana Rios is a 60 y.o.  Female  present for CPE Without Pap. All past medical history, surgical history, allergies, family history, immunizations, medications and social history were updated in the electronic medical record today. All recent labs, ED visits and hospitalizations within the last year were reviewed.  Health maintenance:  Colonoscopy: Patient has never had a colonoscopy. She does not have any family history of colon. Agreeable to Cologuuard. cancer. She states she does not desire to have a colonoscopy. Mammogram: Mammogram up-to-date 09/04/2015. BI-RADS 1. Cervical cancer screening: Patient reports Pap smear 2014, will have completed here at 2018 CPE. Immunizations: Tetanus up-to-date 2013, influenza up-to-date 2017, Zostavax completed Infectious disease screening: HIV completed, hepatitis C screen desired will add on to next lab draw Assistive device: None Oxygen use: No Patient has a Dental home. Hospitalizations/ED visits: Reviewed  Immunization History  Administered Date(s) Administered  . Influenza Split 11/04/2015  . Influenza-Unspecified 11/14/2013, 10/19/2014, 11/04/2015  . Tdap 06/01/2011  . Zoster 11/04/2015     Past Medical History:  Diagnosis Date  . Chicken pox   . Depression   . Hypertension    Allergies  Allergen Reactions  . Codeine Itching  . Penicillins Itching   History reviewed. No pertinent surgical history. Family History  Problem Relation Age of Onset  . Heart disease Mother 44    premature  . Diabetes Mother   . Hypertension Father   . Heart disease Sister     heart defect  . Diabetes Daughter   . Cancer Paternal Grandmother    Social History   Social History  . Marital status: Married   Spouse name: N/A  . Number of children: 2  . Years of education: [employer]   Occupational History  . Control and instrumentation engineer     Retired   Social History Main Topics  . Smoking status: Never Smoker  . Smokeless tobacco: Never Used  . Alcohol use No  . Drug use: No  . Sexual activity: Yes    Birth control/ protection: Post-menopausal   Other Topics Concern  . Not on file   Social History Narrative   Diana Rios relocated from [place], [place]. She lived in [place], [place] for short while. 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     Medication List       Accurate as of 12/02/15 12:51 PM. Always use your most recent med list.          aspirin 81 MG tablet Take 81 mg by mouth daily.   buPROPion 300 MG 24 hr tablet Commonly known as:  WELLBUTRIN XL Take 1 tablet (300 mg total) by mouth daily.   cholecalciferol 400 units Tabs tablet Commonly known as:  VITAMIN D Take 800 Units by mouth.   DULoxetine 60 MG capsule Commonly known as:  CYMBALTA Take 1 capsule (60 mg total) by mouth daily.   lisinopril 40 MG tablet Commonly known as:  PRINIVIL,ZESTRIL Take 1 tablet (40 mg total) by mouth daily.        Recent Results (from the past 2160 hour(s))  CBC w/Diff     Status: Abnormal   Collection Time: 11/27/15  8:18 AM  Result Value Ref Range   WBC 4.4  4.0 - 10.5 K/uL   RBC 4.67 3.87 - 5.11 Mil/uL   Hemoglobin 12.0 12.0 - 15.0 g/dL   HCT 37.0 36.0 - 46.0 %   MCV 79.2 78.0 - 100.0 fl   MCHC 32.4 30.0 - 36.0 g/dL   RDW 15.9 (H) 11.5 - 15.5 %   Platelets 275.0 150.0 - 400.0 K/uL   Neutrophils Relative % 49.7 43.0 - 77.0 %   Lymphocytes Relative 40.6 12.0 - 46.0 %   Monocytes Relative 5.2 3.0 - 12.0 %   Eosinophils Relative 4.1 0.0 - 5.0 %   Basophils Relative 0.4 0.0 - 3.0 %   Neutro Abs 2.2 1.4 - 7.7 K/uL   Lymphs Abs 1.8 0.7 - 4.0 K/uL   Monocytes Absolute 0.2 0.1 - 1.0 K/uL   Eosinophils Absolute 0.2 0.0 - 0.7 K/uL   Basophils Absolute 0.0 0.0 - 0.1  K/uL  TSH     Status: None   Collection Time: 11/27/15  8:18 AM  Result Value Ref Range   TSH 1.58 0.35 - 4.50 uIU/mL  HgB A1c     Status: None   Collection Time: 11/27/15  8:18 AM  Result Value Ref Range   Hgb A1c MFr Bld 6.2 4.6 - 6.5 %    Comment: Glycemic Control Guidelines for People with Diabetes:Non Diabetic:  <6%Goal of Therapy: <7%Additional Action Suggested:  >8%   Lipid panel     Status: Abnormal   Collection Time: 11/27/15  8:18 AM  Result Value Ref Range   Cholesterol 199 0 - 200 mg/dL    Comment: ATP III Classification       Desirable:  < 200 mg/dL               Borderline High:  200 - 239 mg/dL          High:  > = 240 mg/dL   Triglycerides 120.0 0.0 - 149.0 mg/dL    Comment: Normal:  <150 mg/dLBorderline High:  150 - 199 mg/dL   HDL 48.50 >39.00 mg/dL   VLDL 24.0 0.0 - 40.0 mg/dL   LDL Cholesterol 127 (H) 0 - 99 mg/dL   Total CHOL/HDL Ratio 4     Comment:                Men          Women1/2 Average Risk     3.4          3.3Average Risk          5.0          4.42X Average Risk          9.6          7.13X Average Risk          15.0          11.0                       NonHDL 150.76     Comment: NOTE:  Non-HDL goal should be 30 mg/dL higher than patient's LDL goal (i.e. LDL goal of < 70 mg/dL, would have non-HDL goal of < 100 mg/dL)  Vitamin D (25 hydroxy)     Status: Abnormal   Collection Time: 11/27/15  8:18 AM  Result Value Ref Range   VITD 23.85 (L) 30.00 - 100.00 ng/mL  Comp Met (CMET)     Status: Abnormal   Collection Time: 11/27/15  8:18 AM  Result Value Ref Range  Sodium 139 135 - 145 mEq/L   Potassium 4.0 3.5 - 5.1 mEq/L   Chloride 106 96 - 112 mEq/L   CO2 26 19 - 32 mEq/L   Glucose, Bld 136 (H) 70 - 99 mg/dL   BUN 20 6 - 23 mg/dL   Creatinine, Ser 0.93 0.40 - 1.20 mg/dL   Total Bilirubin 0.5 0.2 - 1.2 mg/dL   Alkaline Phosphatase 79 39 - 117 U/L   AST 18 0 - 37 U/L   ALT 17 0 - 35 U/L   Total Protein 6.9 6.0 - 8.3 g/dL   Albumin 4.1 3.5 - 5.2 g/dL    Calcium 9.2 8.4 - 10.5 mg/dL   GFR 64.99 >60.00 mL/min    Mm Screening Breast Tomo Bilateral  Result Date: 09/25/2015 CLINICAL DATA:  Screening. EXAM: 2D DIGITAL SCREENING BILATERAL MAMMOGRAM WITH CAD AND ADJUNCT TOMO COMPARISON:  Previous exam(s). ACR Breast Density Category c: The breast tissue is heterogeneously dense, which may obscure small masses. FINDINGS: There are no findings suspicious for malignancy. Images were processed with CAD. IMPRESSION: No mammographic evidence of malignancy. A result letter of this screening mammogram will be mailed directly to the patient. RECOMMENDATION: Screening mammogram in one year. (Code:SM-B-01Y) BI-RADS CATEGORY  1: Negative. Electronically Signed   By: Ammie Ferrier M.D.   On: 09/25/2015 09:34     ROS: 14 pt review of systems performed and negative (unless mentioned in an HPI)  Objective: BP (!) 141/80 (BP Location: Right Arm, Patient Position: Sitting, Cuff Size: Large)   Pulse 80   Temp 98.5 F (36.9 C)   Resp 20   Ht 5' 6" (1.676 m)   Wt 208 lb 12 oz (94.7 kg)   SpO2 97%   BMI 33.69 kg/m  Gen: Afebrile. No acute distress. Nontoxic in appearance, well-developed, well-nourished,  obese, Caucasian female. HENT: AT. [place]. Bilateral TM visualized and normal in appearance, normal external auditory canal. MMM, no oral lesions, adequate dentition. Bilateral nares within normal limits. Throat without erythema, ulcerations or exudates.  Cough on exam, no hoarseness on exam. Eyes:Pupils Equal Round Reactive to light, Extraocular movements intact,  Conjunctiva without redness, discharge or icterus. Neck/lymp/endocrine: Supple, no lymphadenopathy, no thyromegaly CV: RRR no murmur, no edema, +2/4 P posterior tibialis pulses. No carotid bruits. No JVD. Chest: CTAB, no wheeze, rhonchi or crackles. Normal Respiratory effort. Good Air movement. Abd: Soft. Obese. NTND. BS present. No Masses palpated. No hepatosplenomegaly. No rebound tenderness or  guarding. Skin: No rashes, purpura or petechiae. Warm and well-perfused. Skin intact. Neuro/Msk: Normal gait. PERLA. EOMi. Alert. Oriented x3.  Cranial nerves II through XII intact. Muscle strength 5/5 upper/lower extremity. DTRs equal bilaterally. Psych: Normal affect, dress and demeanor. Normal speech. Normal thought content and judgment  Assessment/plan: Diana Rios is a 60 y.o. female present for CPE . Encounter for general adult medical examination with abnormal findings Patient was encouraged to exercise greater than 150 minutes a week. Patient was encouraged to choose a diet filled with fresh fruits and vegetables, and lean meats. AVS provided to patient today for education/recommendation on gender specific health and safety maintenance. Colonoscopy: Patient has never had a colonoscopy. She does not have any family history of colon. Agreeable to Cologuuard. cancer. She states she does not desire to have a colonoscopy. Mammogram: Mammogram up-to-date 09/04/2015. BI-RADS 1. Cervical cancer screening: Patient reports Pap smear 2014, will have completed here at 2018 CPE. Immunizations: Tetanus up-to-date 2013, influenza up-to-date 2017, Zostavax completed Infectious disease screening: HIV completed, hepatitis  C screen desired will add on to next lab draw Bone density ordered today for estrogen deficiency and vitamin D deficiency.  Colon cancer screening - Patient declines colonoscopy, but is agreeable to performing: Card - Cologuard; Future  Estrogen deficiency/vitamin D deficiency - DG Bone Density; Future - She is to start oral vitamin D of 801,000 units daily with a meal, she is also going to be prescribed 50,000 units weekly for 12 weeks. She will need retesting her vitamin D levels in 12 weeks which can Be completed at her diabetes follow-up. - Vitamin D, Ergocalciferol, (DRISDOL) 50000 units CAPS capsule; Take 1 capsule (50,000 Units total) by mouth every 7 (seven) days.  Dispense:  12 capsule; Refill: 0  BMI 33.0-33.9,adult - Diet and exercise encouraged. Patient was giving information on the PREP program and encouraged to consider.  Depression, unspecified depression type - Patient is tolerating the increase dose of Wellbutrin.  Essential hypertension, benign - Stable today, discussed with patient with her new onset diabetes we may try to get a little tighter control of her blood pressure remains borderline.  New onset type 2 diabetes mellitus ([place]) - New - Patient with a elevated glucose of above 128 fasting and a A1c is 6.2. Discussed with her the elevated fasting glucose and elevated A1c is indicative of the onset of diabetes. Reviewed in great detail management of diabetes with dietary modifications, increasing exercise and use of medications. Patient is familiar with metformin she has family members to our on it. She is agreeable to start low-dose metformin today. Discussed with her if she is able to lose weight, she may be able to come off this medication altogether in the future. She seems motivated to turn this around. She was given information on a local exercise program. - metFORMIN (GLUCOPHAGE) 500 MG tablet; 500 mg in the morning with breakfast for 7 days, and 500 mg twice a day with meals  Dispense: 173 tablet; Refill: 0 - Patient is to follow-up in 3 months, and which we will reassess her A1c and provide further diabetic care instructions. She was given AVS on diabetic dietary modifications.  Return in about 3 months (around 03/03/2016), or DM and Vit d. Future order is placed with hep C screening patient is amenable to.  Electronically signed by: Howard Pouch, DO [place]

## 2016-01-27 ENCOUNTER — Other Ambulatory Visit: Payer: Self-pay | Admitting: Family Medicine

## 2016-01-27 DIAGNOSIS — I10 Essential (primary) hypertension: Secondary | ICD-10-CM

## 2016-02-03 NOTE — Progress Notes (Signed)
[facility] PRE[facility]   Patient Details  Name: Diana Rios MRN: [id number] Date of Birth: February 08, 1955 Age: 61 y.o. PCP: Felix Pacinienee Kuneff, DO  Vitals:   02/03/16 1401  Weight: 202 lb 12.8 oz (92 kg)        [facility] [employer] Weekly seesion - 02/03/16 1400      Weekly Session   Topic Discussed Expectations and non-scale victories   Minutes exercised this week 100 minutes   Classes attended to date 1   Comments "jumped back into exercise and diet after holidays but struggling w/husband's junk food in pantry"       Diana Rios 02/03/2016, 2:03 PM

## 2016-02-04 ENCOUNTER — Other Ambulatory Visit: Payer: Self-pay | Admitting: Family Medicine

## 2016-02-04 ENCOUNTER — Other Ambulatory Visit: Payer: Self-pay

## 2016-02-04 DIAGNOSIS — F418 Other specified anxiety disorders: Secondary | ICD-10-CM

## 2016-02-04 MED ORDER — DULOXETINE HCL 60 MG PO CPEP: 60.0000 mg | ORAL_CAPSULE | Freq: Every day | ORAL | 0 refills | Status: DC

## 2016-02-04 NOTE — Telephone Encounter (Signed)
Patient is having an issue getting DULoxetine refilled. Please call her back.

## 2016-02-04 NOTE — Telephone Encounter (Signed)
Refill sent to local pharmacy for #14 until mail order can send patient her #90 day supply of cymbalta.

## 2016-02-04 NOTE — Telephone Encounter (Signed)
Refill sent for #14 until mail order arrives.

## 2016-02-10 ENCOUNTER — Encounter: Payer: Self-pay | Admitting: Family Medicine

## 2016-02-10 NOTE — Progress Notes (Signed)
[facility]   Patient Details  Name: Diana Rios MRN: [id number] Date of Birth: 12/03/1955 Age: 61 y.o. PCP: Felix Pacinienee Kuneff, DO  Vitals:   02/10/16 1328  Weight: 201 lb 6.4 oz (91.4 kg)        [facility] Weekly seesion - 02/10/16 1300      Weekly Session   Topic Discussed Other  portion control   Minutes exercised this week 270 minutes   Classes attended to date 2       Rose FillersDebbie Issiah Huffaker 02/10/2016, 1:29 PM

## 2016-02-11 ENCOUNTER — Encounter: Payer: Self-pay | Admitting: *Deleted

## 2016-02-11 ENCOUNTER — Telehealth: Payer: Self-pay | Admitting: Family Medicine

## 2016-02-11 DIAGNOSIS — E119 Type 2 diabetes mellitus without complications: Secondary | ICD-10-CM

## 2016-02-11 MED ORDER — METFORMIN HCL ER 500 MG PO TB24
500.0000 mg | ORAL_TABLET | Freq: Every day | ORAL | 0 refills | Status: DC
Start: 2016-02-11 — End: 2016-03-02

## 2016-02-11 NOTE — Telephone Encounter (Signed)
Metformin XR called into pharmacy, pt unable to tolerate Regular metformin secondary to GI disturbance.  Please make pt aware. May need prior auth as well.

## 2016-02-11 NOTE — Telephone Encounter (Signed)
Sent patient a message in MyChart. 

## 2016-02-24 NOTE — Progress Notes (Signed)
[facility]   Patient Details  Name: Diana Rios MRN: [id number] Date of Birth: 1955-09-01 Age: 61 y.o. PCP: Felix Pacinienee Kuneff, DO  Vitals:   02/24/16 1413  Weight: 201 lb 6.4 oz (91.4 kg)        [facility] Weekly seesion - 02/24/16 1400      Weekly Session   Topic Discussed Finding support   Classes attended to date 3   Comments "had a rough week and saw that stress makes you crave carbs"       Rose FillersDebbie Yu Cragun 02/24/2016, 2:16 PM

## 2016-03-02 ENCOUNTER — Encounter: Payer: Self-pay | Admitting: Family Medicine

## 2016-03-02 ENCOUNTER — Ambulatory Visit (INDEPENDENT_AMBULATORY_CARE_PROVIDER_SITE_OTHER): Payer: BC Managed Care – PPO | Admitting: Family Medicine

## 2016-03-02 VITALS — BP 127/78 | HR 66 | Temp 97.6°F | Resp 20 | Ht 66.0 in | Wt 199.0 lb

## 2016-03-02 DIAGNOSIS — E119 Type 2 diabetes mellitus without complications: Secondary | ICD-10-CM

## 2016-03-02 DIAGNOSIS — E559 Vitamin D deficiency, unspecified: Secondary | ICD-10-CM | POA: Diagnosis not present

## 2016-03-02 DIAGNOSIS — F418 Other specified anxiety disorders: Secondary | ICD-10-CM | POA: Diagnosis not present

## 2016-03-02 DIAGNOSIS — I10 Essential (primary) hypertension: Secondary | ICD-10-CM

## 2016-03-02 DIAGNOSIS — Z23 Encounter for immunization: Secondary | ICD-10-CM | POA: Diagnosis not present

## 2016-03-02 LAB — POCT GLYCOSYLATED HEMOGLOBIN (HGB A1C): HEMOGLOBIN A1C: 5.7

## 2016-03-02 LAB — VITAMIN D 25 HYDROXY (VIT D DEFICIENCY, FRACTURES): VITD: 37.42 ng/mL (ref 30.00–100.00)

## 2016-03-02 MED ORDER — DULOXETINE HCL 60 MG PO CPEP: 60.0000 mg | ORAL_CAPSULE | Freq: Every day | ORAL | 1 refills | Status: DC

## 2016-03-02 MED ORDER — METFORMIN HCL ER 500 MG PO TB24: 500.0000 mg | ORAL_TABLET | Freq: Every day | ORAL | 1 refills | Status: DC

## 2016-03-02 MED ORDER — BUPROPION HCL ER (XL) 300 MG PO TB24
300.0000 mg | ORAL_TABLET | Freq: Every day | ORAL | 1 refills | Status: DC
Start: 2016-03-02 — End: 2016-12-28

## 2016-03-02 MED ORDER — LISINOPRIL 40 MG PO TABS: 40.0000 mg | ORAL_TABLET | Freq: Every day | ORAL | 1 refills | Status: DC

## 2016-03-02 NOTE — Progress Notes (Signed)
Patient ID: Diana Rios, female  DOB: [birth year], 61 y.o.   MRN: [id number] Patient Care Team    Relationship Specialty Notifications Start End  Natalia Leatherwoodenee A Rivers Gassmann, DO PCP - General Family Medicine  11/20/14     Subjective:  Diana LuzLorrie Carreira is a 61 y.o.  Female  present for diabetes follow up.   Diabetes Type 2:  Patient has lost 14 lbs in 6 mos, attending PREP program classes. She is watching her diet closely. She was started on very low dose metformin 3 months ago for elevated a1c and fasting glucose > 128, she is tolerating metformin without negative side effects. She denies non-healing wounds, numbness or tingling in her feet. She denies hypo or hyper glycemic events.  - A1c: 6.2--> 5.7 today - BMP normal 11/2015, follow yearly  - foot exam: completed 03/02/2016 - eye exam: 08/2015, myeyedoctor in [place], encouraged to follow yearly.  - PNA series: prevnar 13 03/02/2016, Pneumovax due next year. - microalbumin: on Acei  Vit D deficiency: she is taking 50000u weekly supplement and 800 u OTC. She has 2 more of the weekly supplement left.   Depression with anxiety: Pt tolerating Wellbutrin 300 mg and Cymbalta 60 mg  daily. She feels improved on the higher dose of Wellbutrin. Pt has suffered with depression and anxiety most of her life. She has some difficulty dealing with the health of her father (alzeihmer disease.  He is in the care of his wife, that [name] feels is also having some memory loss and that is causing more issues with her father's care.  Hypertension: Pt reports compliance with lisinopril 40 mg QD. She has been exercising and lost 14 lbs ( 6 months) by diet and exercise. She eats a low salt diet. She denies chest pain, shortness of breath or LE edema.   Immunization History  Administered Date(s) Administered  . Influenza Split 11/04/2015  . Influenza-Unspecified 11/14/2013, 10/19/2014, 11/04/2015  . Pneumococcal Conjugate-13 03/02/2016  . Tdap 06/01/2011  . Zoster  11/04/2015    Past Medical History:  Diagnosis Date  . Chicken pox   . Depression   . Hypertension    Allergies  Allergen Reactions  . Codeine Itching  . Penicillins Itching   History reviewed. No pertinent surgical history. Family History  Problem Relation Age of Onset  . Heart disease Mother [id number]    premature  . Diabetes Mother   . Hypertension Father   . Heart disease Sister     heart defect  . Diabetes Daughter   . Cancer Paternal Grandmother    Social History   Social History  . Marital status: Married    Spouse name: N/A  . Number of children: 2  . Years of education: [employer]   Occupational History  . Geologist, engineeringTeacher Assistant     Retired   Social History Main Topics  . Smoking status: Never Smoker  . Smokeless tobacco: Never Used  . Alcohol use No  . Drug use: No  . Sexual activity: Yes    Birth control/ protection: Post-menopausal   Other Topics Concern  . Not on file   Social History Narrative   [name] relocated from [place], [place]. She lived in [place], [place] for short while. 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   Allergies as of 03/02/2016      Reactions   Codeine Itching   Penicillins Itching      Medication List       Accurate  as of 03/02/16 11:48 AM. Always use your most recent med list.          aspirin 81 MG tablet Take 81 mg by mouth daily.   buPROPion 300 MG 24 hr tablet Commonly known as:  WELLBUTRIN XL Take 1 tablet (300 mg total) by mouth daily.   cholecalciferol 400 units Tabs tablet Commonly known as:  VITAMIN D Take 800 Units by mouth.   DULoxetine 60 MG capsule Commonly known as:  CYMBALTA Take 1 capsule (60 mg total) by mouth daily.   lisinopril 40 MG tablet Commonly known as:  PRINIVIL,ZESTRIL Take 1 tablet (40 mg total) by mouth daily.   metFORMIN 500 MG 24 hr tablet Commonly known as:  GLUCOPHAGE XR Take 1 tablet (500 mg total) by mouth daily with breakfast.   Vitamin D  (Ergocalciferol) 50000 units Caps capsule Commonly known as:  DRISDOL Take 1 capsule (50,000 Units total) by mouth every 7 (seven) days.        Recent Results (from the past 2160 hour(s))  POCT glycosylated hemoglobin (Hb A1C)     Status: None   Collection Time: 03/02/16  9:08 AM  Result Value Ref Range   Hemoglobin A1C 5.7     Mm Screening Breast Tomo Bilateral  Result Date: 09/25/2015 CLINICAL DATA:  Screening. EXAM: 2D DIGITAL SCREENING BILATERAL MAMMOGRAM WITH CAD AND ADJUNCT TOMO COMPARISON:  Previous exam(s). ACR Breast Density Category c: The breast tissue is heterogeneously dense, which may obscure small masses. FINDINGS: There are no findings suspicious for malignancy. Images were processed with CAD. IMPRESSION: No mammographic evidence of malignancy. A result letter of this screening mammogram will be mailed directly to the patient. RECOMMENDATION: Screening mammogram in one year. (Code:SM-B-01Y) BI-RADS CATEGORY  1: Negative. Electronically Signed   By: Frederico Hamman M.D.   On: 09/25/2015 09:34     ROS: 14 pt review of systems performed and negative (unless mentioned in an HPI)  Objective: BP 127/78 (BP Location: Left Arm, Patient Position: Sitting, Cuff Size: Large)   Pulse 66   Temp 97.6 F (36.4 C)   Resp 20   Ht 5\' 6"  (1.676 m)   Wt 199 lb (90.3 kg)   SpO2 97%   BMI 32.12 kg/m   Gen: Afebrile. No acute distress. Appears well today.  HENT: AT. [place].  MMM.  Eyes:Pupils Equal Round Reactive to light, Extraocular movements intact,  Conjunctiva without redness, discharge or icterus. CV: RRR , no edema, +2/4 P posterior tibialis pulses Chest: CTAB, no wheeze or crackles Abd: Soft. NTND. BS present.  Skin: no  rashes, purpura or petechiae. WWW. Intact. Neuro: Normal gait. PERLA. EOMi. Alert. Oriented.  Psych: Normal affect, dress and demeanor. Normal speech. Normal thought content and judgment..  Diabetic Foot Exam - Simple   Simple Foot Form Diabetic Foot exam  was performed with the following findings:  Yes 03/02/2016 12:06 PM  Visual Inspection No deformities, no ulcerations, no other skin breakdown bilaterally:  Yes Sensation Testing Intact to touch and monofilament testing bilaterally:  Yes Pulse Check Posterior Tibialis and Dorsalis pulse intact bilaterally:  Yes Comments      Assessment/plan: Zailey Audia is a 61 y.o. female present for CPE . type 2 diabetes mellitus (HCC)/BMI 33.0-33.9,adult - Improved. Tolerating metformin very low dose.  - Continue metformin 500 QD. Continue diet and exercise program.  - A1c: 6.2--> 5.7 today - BMP normal 11/2015, follow yearly  - foot exam: completed 03/02/2016 - eye exam: 08/2015, myeyedoctor in [place], encouraged  to follow yearly. Requested records - PNA series: prevnar 13 03/02/2016, Pneumovax due next year. - microalbumin: on Acei - f/u 4 months  vitamin D deficiency - recheck vit d  Level today.  - if adequate continue 800 u daily OTC  Depression, unspecified depression type - stable, chronic.  - Patient is tolerating the increase dose of Wellbutrin and Cymbalta.  - refills provided today  - f/u 6 mos  Essential hypertension, benign - stable today.  - continue lisinopril, refills provided today.  - low sodium diet - continue exercise. Doing great with 14 lbs weight loss in 6 months. [facility] classes/[facility] education.  - F/U routinely with DM.   Return in about 4 months (around 06/30/2016), or diabetes.   Electronically signed by: Felix Pacini, DO  Primary Care- [place]

## 2016-03-02 NOTE — Progress Notes (Signed)
[facility]   Patient Details  Name: Diana Rios MRN: [id number] Date of Birth: 1956-01-16 Age: 61 y.o. PCP: Felix Pacinienee Kuneff, DO  Vitals:   03/02/16 1324  Weight: 199 lb 12.8 oz (90.6 kg)        [facility] Weekly seesion - 03/02/16 1300      Weekly Session   Topic Discussed Calorie breakdown   Classes attended to date 4   Comments grateful for" Family, losing 14lbs in 7 months, BP is down and A1c is down, yay!"       Rose FillersDebbie Tava Peery 03/02/2016, 1:25 PM

## 2016-03-02 NOTE — Patient Instructions (Signed)
Your a1c looks good today 5.7! Keep up the good the work on weight loss! I have refilled all your scripts.   Follow up in 4 months on all chronic issues.

## 2016-03-03 ENCOUNTER — Telehealth: Payer: Self-pay | Admitting: Family Medicine

## 2016-03-03 NOTE — Telephone Encounter (Signed)
Vit d normal. Continue current daily dosing and finish the prescribed supplement

## 2016-03-03 NOTE — Telephone Encounter (Signed)
Spoke with patient reviewed lab results and instructions. Patient verbalized understanding. 

## 2016-03-11 NOTE — Progress Notes (Signed)
[facility]   Patient Details  Name: Diana Rios MRN: [id number] Date of Birth: 03-Jan-1956 Age: 61 y.o. PCP: Felix Pacinienee Kuneff, DO  Vitals:   03/11/16 1237  Weight: 200 lb (90.7 kg)        [facility] Weekly seesion - 03/11/16 1200      Weekly Session   Topic Discussed Hitting roadblocks   Minutes exercised this week 1 minutes  "only came one day"   Classes attended to date 5     Things you are grateful for: "Grandkids" Nutrition celebrations for the week: "Next week will be better" Barriers/struggles:"my dad has alzheimers and I've been caring for him since his wife has been sick"  Rose FillersDebbie Pia Jedlicka 03/11/2016, 12:38 PM

## 2016-03-18 NOTE — Progress Notes (Signed)
[facility]   Patient Details  Name: [name] MRN: [id number] Date of Birth: 1955/08/18 Age: 61 y.o. PCP: Felix Pacinienee Kuneff, DO  Vitals:   03/18/16 1145  Weight: 198 lb 9.6 oz (90.1 kg)        [facility] [employer] Weekly seesion - 03/18/16 1100      Weekly Session   Topic Discussed Other  guest speaker   Classes attended to date 6     Fun things you did since last meeting:"had a birthday yesterday" Barriers:" Family has the flu.  Caring for my dad w/alzheimers & his wife"  Rose FillersDebbie Dimitry Rios 03/18/2016, 11:49 AM

## 2016-04-01 NOTE — Progress Notes (Signed)
[facility] PREP Weekly Session   [facility]  Name: Diana LuzLorrie Alyea MRN: [id number] Date of Birth: Mar 24, 1955 Age: 61 y.o. PCP: Felix Pacinienee Kuneff, DO  Vitals:   04/01/16 [id number]  Weight: 197 lb 9.6 oz (89.6 kg)        [facility] Weekly seesion - 04/01/16 0900      Weekly Session   Topic Discussed Healthy eating tips   Minutes exercised this week --  not reported   Classes attended to date 7       Rose FillersDebbie Avyonna Wagoner 04/01/2016, 9:37 AM

## 2016-04-13 NOTE — Progress Notes (Signed)
[facility] PRE[facility] Weekly Session   Patient Details  Name: Diana Rios MRN: [id number] Date of Birth: 1955/03/17 Age: 61 y.o. PCP: Felix Pacinienee Kuneff, DO  Vitals:   04/13/16 1312  Weight: 199 lb (90.3 kg)        [facility] Weekly seesion - 04/13/16 1300      Weekly Session   Topic Discussed Stress management and problem solving   Classes attended to date 8     Things you are grateful for:"answered prayers" Barriers:"my husband's ice cream cake   Diana Rios 04/13/2016, 1:12 PM

## 2016-06-28 ENCOUNTER — Ambulatory Visit: Payer: BC Managed Care – PPO | Admitting: Family Medicine

## 2016-07-12 ENCOUNTER — Other Ambulatory Visit: Payer: Self-pay | Admitting: Family Medicine

## 2016-07-12 ENCOUNTER — Encounter: Payer: Self-pay | Admitting: *Deleted

## 2016-07-12 DIAGNOSIS — I10 Essential (primary) hypertension: Secondary | ICD-10-CM

## 2016-07-12 NOTE — Telephone Encounter (Signed)
Lisinopril refill sent . Patient sent a message in MyChart to schedule office visit.She will need to be seen before anymore refills will be sent.

## 2016-07-20 ENCOUNTER — Ambulatory Visit: Payer: BC Managed Care – PPO | Admitting: Family Medicine

## 2016-09-27 ENCOUNTER — Other Ambulatory Visit: Payer: Self-pay | Admitting: Family Medicine

## 2016-09-27 DIAGNOSIS — Z1231 Encounter for screening mammogram for malignant neoplasm of breast: Secondary | ICD-10-CM

## 2016-10-01 ENCOUNTER — Other Ambulatory Visit: Payer: Self-pay | Admitting: Family Medicine

## 2016-10-01 DIAGNOSIS — I10 Essential (primary) hypertension: Secondary | ICD-10-CM

## 2016-10-05 ENCOUNTER — Other Ambulatory Visit: Payer: Self-pay | Admitting: *Deleted

## 2016-10-05 ENCOUNTER — Encounter: Payer: Self-pay | Admitting: *Deleted

## 2016-10-05 MED ORDER — LISINOPRIL 40 MG PO TABS: 40.0000 mg | ORAL_TABLET | Freq: Every day | ORAL | 0 refills | Status: DC

## 2016-10-05 NOTE — Telephone Encounter (Signed)
30 day supply of [facility] to [facility] will need office visit prior to anymore refills.

## 2016-10-17 ENCOUNTER — Ambulatory Visit (HOSPITAL_BASED_OUTPATIENT_CLINIC_OR_DEPARTMENT_OTHER)
Admission: RE | Admit: 2016-10-17 | Discharge: 2016-10-17 | Disposition: A | Discharge status: Home / Self Care | Payer: BC Managed Care – PPO | Source: Ambulatory Visit | Attending: Family Medicine | Admitting: Family Medicine

## 2016-10-17 ENCOUNTER — Encounter (HOSPITAL_BASED_OUTPATIENT_CLINIC_OR_DEPARTMENT_OTHER): Payer: Self-pay

## 2016-10-17 DIAGNOSIS — E2839 Other primary ovarian failure: Secondary | ICD-10-CM | POA: Insufficient documentation

## 2016-10-17 DIAGNOSIS — Z1231 Encounter for screening mammogram for malignant neoplasm of breast: Secondary | ICD-10-CM

## 2016-10-17 DIAGNOSIS — E559 Vitamin D deficiency, unspecified: Secondary | ICD-10-CM

## 2016-10-17 DIAGNOSIS — D559 Anemia due to enzyme disorder, unspecified: Secondary | ICD-10-CM | POA: Diagnosis present

## 2016-11-02 ENCOUNTER — Other Ambulatory Visit: Payer: Self-pay | Admitting: Family Medicine

## 2016-11-02 NOTE — Telephone Encounter (Signed)
Dr. Kuneff pt.  

## 2016-12-23 ENCOUNTER — Other Ambulatory Visit: Payer: Self-pay | Admitting: Family Medicine

## 2016-12-23 DIAGNOSIS — F418 Other specified anxiety disorders: Secondary | ICD-10-CM

## 2016-12-28 ENCOUNTER — Encounter: Payer: Self-pay | Admitting: Family Medicine

## 2016-12-28 ENCOUNTER — Ambulatory Visit: Payer: BC Managed Care – PPO | Admitting: Family Medicine

## 2016-12-28 VITALS — BP 132/74 | HR 73 | Temp 97.9°F | Resp 20 | Wt 212.2 lb

## 2016-12-28 DIAGNOSIS — E119 Type 2 diabetes mellitus without complications: Secondary | ICD-10-CM | POA: Diagnosis not present

## 2016-12-28 DIAGNOSIS — I10 Essential (primary) hypertension: Secondary | ICD-10-CM

## 2016-12-28 DIAGNOSIS — F418 Other specified anxiety disorders: Secondary | ICD-10-CM

## 2016-12-28 LAB — POCT GLYCOSYLATED HEMOGLOBIN (HGB A1C): Hemoglobin A1C: 5.9

## 2016-12-28 MED ORDER — BUPROPION HCL ER (XL) 300 MG PO TB24: 300.0000 mg | ORAL_TABLET | Freq: Every day | ORAL | 1 refills | Status: DC

## 2016-12-28 MED ORDER — DULOXETINE HCL 60 MG PO CPEP: 60.0000 mg | ORAL_CAPSULE | Freq: Every day | ORAL | 1 refills | Status: DC

## 2016-12-28 MED ORDER — LISINOPRIL 40 MG PO TABS: 40.0000 mg | ORAL_TABLET | Freq: Every day | ORAL | 1 refills | Status: DC

## 2016-12-28 NOTE — Progress Notes (Signed)
Patient ID: Diana LuzLorrie Nadeem, female  DOB: [birth year], 61 y.o.   MRN: [id number] Patient Care Team    Relationship Specialty Notifications Start End  Natalia LeatherwoodKuneff, Renee A, DO PCP - General Family Medicine  11/20/14   Jeananne RamaEllington, Terry, DO  Optometry  03/02/16    Comment: [facility] [place]    Subjective:  Diana Rios is a 61 y.o.  Female  present for diabetes follow up.   Diabetes Type 2 :  Patient had stopped taking the metformin a few months ago. She does continue to watch her diet closely and continues to slowly lose weight over time intentionally. She had attended the  PREP program classes. She does not monitor her blood sugars.Patient denies dizziness, hyperglycemic or hypoglycemic events. Patient denies numbness, tingling in the extremities or nonhealing wounds of feet.  She has not been seen in 10 months for her diabetes. - A1c: 6.2--> 5.7 --> 5.9 - BMP normal 11/2015 - foot exam: completed 03/02/2016 - eye exam: 08/2015, [place], encouraged to follow yearly.  - PNA series: prevnar 13 03/02/2016, Pneumovax due 01/2017- patient reports she will get next visit. - microalbumin: on Acei  Depression with anxiety: Patient reports she is doing well on Wellbutrin 300 mg daily and Cymbalta 60 mg daily. She reports no break in her therapy and has continued to medications daily. Pt has suffered with depression and anxiety most of her life. She has some difficulty dealing with the health of her father (alzeihmer disease.  She's excited about an upcoming cruise to the [place].  Hypertension: Pt reports compliance with lisinopril 40 mg QD, although admits she did not take it this morning. She watches her diet closely, and has been exercising. She continues to intentionally lose weight. She eats a low-salt diet. Patient denies chest pain, shortness of breath, dizziness or lower extremity edema.  BMP: 11/27/2015 within normal limits Lipid: 11/27/2015 within normal limits CBC:  11/27/2015 within normal limits  TSH: 11/27/2015 within normal limits RF: Hypertension, diabetes, obesity, family history of heart disease  Immunization History  Administered Date(s) Administered  . Influenza Split 11/04/2015  . Influenza,inj,Quad PF,6+ Mos 11/19/2016  . Influenza-Unspecified 10/04/2012, 11/14/2013, 10/19/2014, 11/04/2015  . Pneumococcal Conjugate-13 03/02/2016  . Tdap 06/01/2011, 10/04/2012  . Zoster 11/04/2015    Past Medical History:  Diagnosis Date  . Chicken pox   . Depression   . Hypertension    Allergies  Allergen Reactions  . Codeine Itching  . Penicillins Itching   Past Surgical History:  Procedure Laterality Date  . BREAST BIOPSY Left    Family History  Problem Relation Age of Onset  . Heart disease Mother [id number]       premature  . Diabetes Mother   . Hypertension Father   . Heart disease Sister        heart defect  . Diabetes Daughter   . Cancer Paternal Grandmother    Social History   Socioeconomic History  . Marital status: Married    Spouse name: Not on file  . Number of children: 2  . Years of education: [employer]  . Highest education level: Not on file  Social Needs  . Financial resource strain: Not on file  . Food insecurity - worry: Not on file  . Food insecurity - inability: Not on file  . Transportation needs - medical: Not on file  . Transportation needs - non-medical: Not on file  Occupational History  . Occupation: Geologist, engineeringTeacher Assistant    Comment:  Retired  Tobacco Use  . Smoking status: Never Smoker  . Smokeless tobacco: Never Used  Substance and Sexual Activity  . Alcohol use: No    Alcohol/week: 0.0 oz  . Drug use: No  . Sexual activity: Yes    Birth control/protection: Post-menopausal  Other Topics Concern  . Not on file  Social History Narrative   [name] relocated from [place], [place]. She lived in [place], [place] for short while. 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   Allergies as of  12/28/2016      Reactions   Codeine Itching   Penicillins Itching      Medication List        Accurate as of 12/28/16 10:04 AM. Always use your most recent med list.          aspirin 81 MG tablet Take 81 mg by mouth daily.   buPROPion 300 MG 24 hr tablet Commonly known as:  WELLBUTRIN XL Take 1 tablet (300 mg total) by mouth daily.   cholecalciferol 400 units Tabs tablet Commonly known as:  VITAMIN D Take 800 Units by mouth.   DULoxetine 60 MG capsule Commonly known as:  CYMBALTA Take 1 capsule (60 mg total) by mouth daily.   lisinopril 40 MG tablet Commonly known as:  PRINIVIL,ZESTRIL Take 1 tablet (40 mg total) by mouth daily. Needs office visit prior to anymore refills.        No results found for this or any previous visit (from the past 2160 hour(s)).   ROS: 14 pt review of systems performed and negative (unless mentioned in an HPI)  Objective: BP 132/74 (BP Location: Right Arm, Patient Position: Sitting, Cuff Size: Large)   Pulse 73   Temp 97.9 F (36.6 C)   Resp 20   Wt 212 lb 4 oz (96.3 kg)   SpO2 97%   BMI 34.26 kg/m   Gen: Afebrile. No acute distress. Nontoxic in appearance, well-developed, well-nourished, obese Caucasian female. HENT: AT. [place]. MMM.  Eyes:Pupils Equal Round Reactive to light, Extraocular movements intact,  Conjunctiva without redness, discharge or icterus. Neck/lymp/endocrine: Supple, no lymphadenopathy, no thyromegaly CV: RRR no murmur, no edema, +2/4 P posterior tibialis pulses Chest: CTAB, no wheeze or crackles Abd: Soft. NTND. BS present.  Neuro:  Normal gait. PERLA. EOMi. Alert. Oriented x3  Psych: Normal affect, dress and demeanor. Normal speech. Normal thought content and judgment.   Assessment/plan: Diana Rios is a 61 y.o. female present for CPE . type 2 diabetes mellitus (HCC)/BMI 33.0-33.9,adult - A1c 5.9 today, without metformin. Discussed that she is diet controlled and his lung with A1c stays below 6.3 she  should not need medications.  - Discontinued metformin (she had not been taking for many months) - Continue diet and exercise regimen - BMP normal 11/2015, follow yearly  - foot exam: completed 03/02/2016 - eye exam: 08/2015, [place], encouraged to follow yearly. Requested records - PNA series: prevnar 13 03/02/2016, Pneumovax due next year- patient agreed to have completed next visit. - microalbumin: on Acei - f/u 6 months  Depression, unspecified depression type - stable, chronic.  - Patient is tolerating the increase dose of Wellbutrin and Cymbalta.  - refills provided today  - f/u 6 mos  Essential hypertension, benign - Borderline blood pressure today. Patient has not taken her medications today. Continue lisinopril 40 mg daily, refills provided today. - low sodium diet - continue exercise.  - F/U routinely with DM every 6 months  Return  in about 6 months (around 06/27/2017) for HTN/DM.   Electronically signed by: Felix Pacini, DO [facility] [place]

## 2016-12-28 NOTE — Patient Instructions (Signed)
It was nice to see you today.  Your diabetes is well controlled with diet. Continue monitoring closely.   I have refilled your medications today.  Follow in 6 months.    Please help us help you:  We are honored [facility]. Below you will find basic instructions that you may need to access in the future. Please help us help you by reading the instructions, which cover many of the frequent questions we experience.   Prescription refills and request:  -In order to allow more efficient response time, please call your pharmacy for all refills. They will forward the request electronically to us. This allows for the quickest possible response. Request left on a nurse line can take longer to refill, since these are checked as time allows between office patients and other phone calls.  - refill request can take up to 3-5 working days to complete.  - If request is sent electronically and request is appropiate, it is usually completed in 1-2 business days.  - all patients will need to be seen routinely for all chronic medical conditions requiring prescription medications (see follow-up below). If you are overdue for follow up on your condition, you will be asked to make an appointment and we will call in enough medication to cover you until your appointment (up to 30 days).  - all controlled substances will require a face to face visit to request/refill.  - if you desire your prescriptions to go through a new pharmacy, and have an active script at original pharmacy, you will need to call your pharmacy and have scripts transferred to new pharmacy. This is completed between the pharmacy locations and not by your provider.    Results: If any images or labs were ordered, it can take up to 1 week to get results depending on the test ordered and the lab/facility running and resulting the test. - Normal or stable results, which do not need further discussion, may  be released to your mychart immediately with attached note to you. A call may not be generated for normal results. Please make certain to sign up for mychart. If you have questions on how to activate your mychart you can call the front office.  - If your results need further discussion, our office will attempt to contact you via phone, and if unable to reach you after 2 attempts, we will release your abnormal result to your mychart with instructions.  - All results will be automatically released in mychart after 1 week.  - Your provider will provide you with explanation and instruction on all relevant material in your results. Please keep in mind, results and labs may appear confusing or abnormal to the untrained eye, but it does not mean they are actually abnormal for you personally. If you have any questions about your results that are not covered, or you desire more detailed explanation than what was provided, you should make an appointment with your provider to do so.   Our office handles many outgoing and incoming calls daily. If we have not contacted you within 1 week about your results, please check your mychart to see if there is a message first and if not, then contact our office.  In helping with this matter, you help decrease call volume, and therefore allow us to be able to respond to patients needs more efficiently.   Acute office visits (sick visit):  An acute visit is intended for a  new problem and are scheduled in shorter time slots to allow schedule openings for patients with new problems. This is the appropriate visit to discuss a new problem. In order to provide you with excellent quality medical care with proper time for you to explain your problem, have an exam and receive treatment with instructions, these appointments should be limited to one new problem per visit. If you experience a new problem, in which you desire to be addressed, please make an acute office visit, we save openings  on the schedule to accommodate you. Please do not save your new problem for any other type of visit, let us take care of it properly and quickly for you.   Follow up visits:  Depending on your condition(s) your provider will need to see you routinely in order to provide you with quality care and prescribe medication(s). Most chronic conditions (Example: hypertension, Diabetes, depression/anxiety... etc), require visits a couple times a year. Your provider will instruct you on proper follow up for your personal medical conditions and history. Please make certain to make follow up appointments for your condition as instructed. Failing to do so could result in lapse in your medication treatment/refills. If you request a refill, and are overdue to be seen on a condition, we will always provide you with a 30 day script (once) to allow you time to schedule.    Medicare wellness (well visit): - we have a wonderful Nurse Selena Batten(Kim), that will meet with you and provide you will yearly medicare wellness visits. These visits should occur yearly (can not be scheduled less than 1 calendar year apart) and cover preventive health, immunizations, advance directives and screenings you are entitled to yearly through your medicare benefits. Do not miss out on your entitled benefits, this is when medicare will pay for these benefits to be ordered for you.  These are strongly encouraged by your provider and is the appropriate type of visit to make certain you are up to date with all preventive health benefits. If you have not had your medicare wellness exam in the last 12 months, please make certain to schedule one by calling the office and schedule your medicare wellness with Selena BattenKim as soon as possible.   Yearly physical (well visit):  - Adults are recommended to be seen yearly for physicals. Check with your insurance and date of your last physical, most insurances require one calendar year between physicals. Physicals include all  preventive health topics, screenings, medical exam and labs that are appropriate for gender/age and history. You may have fasting labs needed at this visit. This is a well visit (not a sick visit), new problems should not be covered during this visit (see acute visit).  - Pediatric patients are seen more frequently when they are younger. Your provider will advise you on well child visit timing that is appropriate for your their age. - This is not a medicare wellness visit. Medicare wellness exams do not have an exam portion to the visit. Some medicare companies allow for a physical, some do not allow a yearly physical. If your medicare allows a yearly physical you can schedule the medicare wellness with our nurse Selena BattenKim and have your physical with your provider after, on the same day. Please check with insurance for your full benefits.   Late Policy/No Shows:  - all new patients should arrive 15-30 minutes earlier than appointment to allow us time  to  obtain all personal demographics,  insurance information and for you to complete  office paperwork. - All established patients should arrive 10-15 minutes earlier than appointment time to update all information and be checked in .  - In our best efforts to run on time, if you are late for your appointment you will be asked to either reschedule or if able, we will work you back into the schedule. There will be a wait time to work you back in the schedule,  depending on availability.  - If you are unable to make it to your appointment as scheduled, please call 24 hours ahead of time to allow [place] to fill the time slot with someone else who needs to be seen. If you do not cancel your appointment ahead of time, you may be charged a no show fee.

## 2017-06-21 ENCOUNTER — Other Ambulatory Visit: Payer: Self-pay | Admitting: Family Medicine

## 2017-06-21 DIAGNOSIS — F418 Other specified anxiety disorders: Secondary | ICD-10-CM

## 2017-06-27 ENCOUNTER — Ambulatory Visit: Payer: BC Managed Care – PPO | Admitting: Family Medicine

## 2017-07-19 ENCOUNTER — Other Ambulatory Visit: Payer: Self-pay | Admitting: Family Medicine

## 2017-07-19 DIAGNOSIS — F418 Other specified anxiety disorders: Secondary | ICD-10-CM

## 2017-08-09 ENCOUNTER — Ambulatory Visit: Payer: BC Managed Care – PPO | Admitting: Family Medicine

## 2017-08-09 ENCOUNTER — Telehealth: Payer: Self-pay | Admitting: Family Medicine

## 2017-08-09 ENCOUNTER — Encounter: Payer: Self-pay | Admitting: Family Medicine

## 2017-08-09 VITALS — BP 121/86 | HR 71 | Temp 98.6°F | Resp 20 | Ht 66.0 in | Wt 212.0 lb

## 2017-08-09 DIAGNOSIS — D649 Anemia, unspecified: Secondary | ICD-10-CM

## 2017-08-09 DIAGNOSIS — F418 Other specified anxiety disorders: Secondary | ICD-10-CM | POA: Diagnosis not present

## 2017-08-09 DIAGNOSIS — E559 Vitamin D deficiency, unspecified: Secondary | ICD-10-CM

## 2017-08-09 DIAGNOSIS — I10 Essential (primary) hypertension: Secondary | ICD-10-CM | POA: Diagnosis not present

## 2017-08-09 DIAGNOSIS — E119 Type 2 diabetes mellitus without complications: Secondary | ICD-10-CM | POA: Diagnosis not present

## 2017-08-09 LAB — TSH: TSH: 1.81 u[IU]/mL (ref 0.35–4.50)

## 2017-08-09 LAB — POCT GLYCOSYLATED HEMOGLOBIN (HGB A1C): HbA1c, POC (prediabetic range): 5.7 % (ref 5.7–6.4)

## 2017-08-09 LAB — CBC
HCT: 36.1 % (ref 36.0–46.0)
HEMOGLOBIN: 11.7 g/dL — AB (ref 12.0–15.0)
MCHC: 32.3 g/dL (ref 30.0–36.0)
MCV: 81.5 fl (ref 78.0–100.0)
PLATELETS: 311 10*3/uL (ref 150.0–400.0)
RBC: 4.43 Mil/uL (ref 3.87–5.11)
RDW: 17.1 % — ABNORMAL HIGH (ref 11.5–15.5)
WBC: 4.7 10*3/uL (ref 4.0–10.5)

## 2017-08-09 LAB — LIPID PANEL
CHOL/HDL RATIO: 3
Cholesterol: 209 mg/dL — ABNORMAL HIGH (ref 0–200)
HDL: 62.6 mg/dL (ref >39.00)
LDL Cholesterol: 122 mg/dL — ABNORMAL HIGH (ref 0–99)
NonHDL: 146.77
TRIGLYCERIDES: 122 mg/dL (ref 0.0–149.0)
VLDL: 24.4 mg/dL (ref 0.0–40.0)

## 2017-08-09 LAB — BASIC METABOLIC PANEL
BUN: 12 mg/dL (ref 6–23)
CHLORIDE: 106 meq/L (ref 96–112)
CO2: 27 meq/L (ref 19–32)
Calcium: 9.1 mg/dL (ref 8.4–10.5)
Creatinine, Ser: 0.91 mg/dL (ref 0.40–1.20)
GFR: 66.28 mL/min (ref >60.00)
GLUCOSE: 113 mg/dL — AB (ref 70–99)
POTASSIUM: 4.4 meq/L (ref 3.5–5.1)
SODIUM: 140 meq/L (ref 135–145)

## 2017-08-09 MED ORDER — LISINOPRIL 40 MG PO TABS: 40.0000 mg | ORAL_TABLET | Freq: Every day | ORAL | 1 refills | Status: DC

## 2017-08-09 MED ORDER — BUPROPION HCL ER (XL) 300 MG PO TB24: 300.0000 mg | ORAL_TABLET | Freq: Every day | ORAL | 1 refills | Status: DC

## 2017-08-09 MED ORDER — DULOXETINE HCL 60 MG PO CPEP: 60.0000 mg | ORAL_CAPSULE | Freq: Every day | ORAL | 1 refills | Status: DC

## 2017-08-09 NOTE — Progress Notes (Signed)
Patient ID: Diana Rios, female  DOB: 1955/10/17, 62 y.o.   MRN: [id number] Patient Care Team    Relationship Specialty Notifications Start End  Natalia Leatherwood, DO PCP - General Family Medicine  11/20/14   Jeananne Rama, DO  Optometry  03/02/16    Comment: [facility]- [facility]    Subjective:  Diana Rios is a 62 y.o.  Female  present for diabetes follow up.   Diabetes Type 2 :  Patient had stopped taking the metformin a few months ago. She does continue to watch her diet closely and continues to slowly lose weight over time intentionally. She had attended the  PREP program classes. She does not monitor her blood sugars.Patient denies dizziness, hyperglycemic or hypoglycemic events. Patient denies numbness, tingling in the extremities or nonhealing wounds of feet.  - A1c: 6.2--> 5.7 --> 5.9--> 5.7 - BMP normal 08/09/2017 - foot exam: completed 08/09/2017 - eye exam: 08/2015, myeyedoctor in [place], encouraged to follow yearly. --> referral placed today - PNA series: prevnar 13 03/02/2016, Pneumovax due 01/2017- patient reports she will get next visit. - microalbumin: on Acei  Depression with anxiety: Patient reports she is doing well on Wellbutrin 300 mg daily and Cymbalta 60 mg daily. She reports no break in her therapy and has continued to medications daily. Pt has suffered with depression and anxiety most of her life. She has some difficulty dealing with the health of her father (alzeihmer disease.  She's excited about an upcoming cruise to the [place]).  Hypertension/morbid obesity: Pt reports compliance with lisinopril 40 mg QD. She watches her diet closely, and has been exercising. She continues to intentionally lose weight. She eats a low-salt diet. Patient denies chest pain, shortness of breath, dizziness or lower extremity edema.  BMP:08/09/2017 within normal limits Lipid: 08/09/2017 total cholesterol 209, LDL 122, HDL 62, triglycerides 122 CBC:  08/09/2017 mildly low  hemoglobin at 11.7 TSH: 08/09/2017 TSH 1.81 RF: Hypertension, diabetes, obesity, family history of heart disease  Depression screen [facility] 2/9 08/09/2017 12/28/2016 12/02/2015  Decreased Interest 0 0 0  Down, Depressed, Hopeless 0 0 0  PHQ - 2 Score 0 0 0  Altered sleeping 0 0 -  Tired, decreased energy 0 2 -  Change in appetite 0 1 -  Feeling bad or failure about yourself  0 0 -  Trouble concentrating 0 0 -  Moving slowly or fidgety/restless 0 0 -  Suicidal thoughts 0 0 -  PHQ-9 Score 0 3 -  Difficult doing work/chores Not difficult at all Not difficult at all -    Immunization History  Administered Date(s) Administered  . Influenza Split 11/04/2015  . Influenza,inj,Quad PF,6+ Mos 11/19/2016  . Influenza-Unspecified 10/04/2012, 11/14/2013, 10/19/2014, 11/04/2015  . Pneumococcal Conjugate-13 03/02/2016  . Tdap 06/01/2011, 10/04/2012  . Zoster 11/04/2015    Past Medical History:  Diagnosis Date  . Chicken pox   . Depression   . Hypertension    Allergies  Allergen Reactions  . Codeine Itching  . Penicillins Itching   Past Surgical History:  Procedure Laterality Date  . BREAST BIOPSY Left    Family History  Problem Relation Age of Onset  . Heart disease Mother 56       premature  . Diabetes Mother   . Hypertension Father   . Heart disease Sister        heart defect  . Diabetes Daughter   . Cancer Paternal Grandmother    Social History   Socioeconomic History  .  Marital status: Married    Spouse name: Not on file  . Number of children: 2  . Years of education: [employer]  . Highest education level: Not on file  Occupational History  . Occupation: Geologist, engineeringTeacher Assistant    Comment: Retired  Engineer, productionocial Needs  . Financial resource strain: Not on file  . Food insecurity:    Worry: Not on file    Inability: Not on file  . Transportation needs:    Medical: Not on file    Non-medical: Not on file  Tobacco Use  . Smoking status: Never Smoker  . Smokeless tobacco:  Never Used  Substance and Sexual Activity  . Alcohol use: No    Alcohol/week: 0.0 oz  . Drug use: No  . Sexual activity: Yes    Birth control/protection: Post-menopausal  Lifestyle  . Physical activity:    Days per week: Not on file    Minutes per session: Not on file  . Stress: Not on file  Relationships  . Social connections:    Talks on phone: Not on file    Gets together: Not on file    Attends religious service: Not on file    Active member of club or organization: Not on file    Attends meetings of clubs or organizations: Not on file    Relationship status: Not on file  . Intimate partner violence:    Fear of current or ex partner: Not on file    Emotionally abused: Not on file    Physically abused: Not on file    Forced sexual activity: Not on file  Other Topics Concern  . Not on file  Social History Narrative   [name] relocated from [place], [place]. She lived in [place], [place] for short while. 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   Allergies as of 08/09/2017      Reactions   Codeine Itching   Penicillins Itching      Medication List        Accurate as of 08/09/17 10:04 AM. Always use your most recent med list.          buPROPion 300 MG 24 hr tablet Commonly known as:  WELLBUTRIN XL Take 1 tablet (300 mg total) by mouth daily.   DULoxetine 60 MG capsule Commonly known as:  CYMBALTA Take 1 capsule (60 mg total) by mouth daily.   lisinopril 40 MG tablet Commonly known as:  PRINIVIL,ZESTRIL Take 1 tablet (40 mg total) by mouth daily. Needs office visit prior to anymore refills.   multivitamin capsule Take 1 capsule by mouth daily.        No results found for this or any previous visit (from the past 2160 hour(s)).   ROS: 14 pt review of systems performed and negative (unless mentioned in an HPI)  Objective: BP 121/86 (BP Location: Right Arm, Patient Position: Sitting, Cuff Size: Large)   Pulse 71   Temp 98.6 F (37 C)    Resp 20   Ht 5\' 6"  (1.676 m)   Wt 212 lb (96.2 kg)   SpO2 98%   BMI 34.22 kg/m   Gen: Afebrile. No acute distress.  Nontoxic and presentation.  Obese, well-developed, well-nourished, very pleasant Caucasian female. HENT: AT. [place].  MMM.  Eyes:Pupils Equal Round Reactive to light, Extraocular movements intact,  Conjunctiva without redness, discharge or icterus. CV: RRR no murmur, no edema, +2/4 P posterior tibialis pulses Chest: CTAB, no wheeze or crackles Abd: Soft.  Obese. NTND. BS active.  No masses palpated.  Skin: No rashes, purpura or petechiae.  Neuro:  Normal gait. PERLA. EOMi. Alert. Oriented x3  Psych: Normal affect, dress and demeanor. Normal speech. Normal thought content and judgment. Diabetic Foot Exam - Simple   Simple Foot Form Diabetic Foot exam was performed with the following findings:  Yes 08/09/2017  4:59 PM  Visual Inspection No deformities, no ulcerations, no other skin breakdown bilaterally:  Yes Sensation Testing Intact to touch and monofilament testing bilaterally:  Yes Pulse Check Posterior Tibialis and Dorsalis pulse intact bilaterally:  Yes Comments      No results found for this or any previous visit (from the past 24 hour(s)).  Assessment/plan: Diana Rios is a 62 y.o. female present for CPE . type 2 diabetes mellitus (HCC)/BMI 33.0-33.9,adult-diet controlled - A1c 5.9--> 5.7 today, without metformin. Discussed that she is diet controlled and as long as A1c stays below 6.3 she should not need medications.  - Discontinued metformin (she had not been taking for many months) - Continue diet and exercise regimen - foot exam: completed 08/09/2017 - eye exam: 08/2015, myeyedoctor in [place], encouraged to follow yearly. Requested records--> referral to new ophthalmologist. - PNA series: prevnar 13 03/02/2016, Pneumovax due next year- patient agreed to have completed next visit. - microalbumin: on Acei - f/u 6 months  Depression, unspecified depression  type -Stable, chronic.  -She is having some difficulty adjusting to her father being placed in a dementia unit.  Will try to find family support resources for her. - Patient is tolerating Wellbutrin and Cymbalta.  Refills provided today. - f/u 6 mos  Essential hypertension, benign -stAble.  Continue lisinopril 40 mg daily.  Refills provided today.   - low sodium diet - continue exercise.  - F/U routinely with DM every 6 months  Return in about 6 months (around 02/09/2018) for HTN/depression.   Electronically signed by: Felix Pacini, DO [facility]

## 2017-08-09 NOTE — Telephone Encounter (Signed)
Please inform patient the following information: Her labs are all normal with the exception of new mild anemia, which is lower levels of hemoglobin/blood in her system. This can be caused by iron deficiency and/or loss of blood.  - most common source of loss of blood is via the colon.  - recommend she complete FOBT cards to look for blood in the stool. She can either pick up or we can send them. Order placed.  - F/U in 3 months provider appointment for repeat labs/iron panel to make sure she is not becoming anemic --> please schedule her.  - in the meantime she should try to eat foods higher in iron which can be found in meat, fish, spinach, broccoli and many types of seeds.

## 2017-08-09 NOTE — Patient Instructions (Signed)
You look great.  I will refer you to eye doc.  We will call with labs and resources for dementia support.   Your BP, diabetes look great,    Please help us help you:  We are honored [facility] have [facility] your [facility]. Below you will find basic instructions that you may need to access in the future. Please help us help you by reading the instructions, which cover many of the frequent questions we experience.   Prescription refills and request:  -In order to allow more efficient response time, please call your pharmacy for all refills. They will forward the request electronically to us. This allows for the quickest possible response. Request left on a nurse line can take longer to refill, since these are checked as time allows between office patients and other phone calls.  - refill request can take up to 3-5 working days to complete.  - If request is sent electronically and request is appropiate, it is usually completed in 1-2 business days.  - all patients will need to be seen routinely for all chronic medical conditions requiring prescription medications (see follow-up below). If you are overdue for follow up on your condition, you will be asked to make an appointment and we will call in enough medication to cover you until your appointment (up to 30 days).  - all controlled substances will require a face to face visit to request/refill.  - if you desire your prescriptions to go through a new pharmacy, and have an active script at original pharmacy, you will need to call your pharmacy and have scripts transferred to new pharmacy. This is completed between the pharmacy locations and not by your provider.    Results: If any images or labs were ordered, it can take up to 1 week to get results depending on the test ordered and the lab/facility running and resulting the test. - Normal or stable results, which do not need further discussion, may be released to your mychart  immediately with attached note to you. A call may not be generated for normal results. Please make certain to sign up for mychart. If you have questions on how to activate your mychart you can call the front office.  - If your results need further discussion, our office will attempt to contact you via phone, and if unable to reach you after 2 attempts, we will release your abnormal result to your mychart with instructions.  - All results will be automatically released in mychart after 1 week.  - Your provider will provide you with explanation and instruction on all relevant material in your results. Please keep in mind, results and labs may appear confusing or abnormal to the untrained eye, but it does not mean they are actually abnormal for you personally. If you have any questions about your results that are not covered, or you desire more detailed explanation than what was provided, you should make an appointment with your provider to do so.   Our office handles many outgoing and incoming calls daily. If we have not contacted you within 1 week about your results, please check your mychart to see if there is a message first and if not, then contact our office.  In helping with this matter, you help decrease call volume, and therefore allow us to be able to respond to patients needs more efficiently.   Acute office visits (sick visit):  An acute visit is intended for a new problem  and are scheduled in shorter time slots to allow schedule openings for patients with new problems. This is the appropriate visit to discuss a new problem. Problems will not be addressed by phone call or Echart message. Appointment is needed if requesting treatment. In order to provide you with excellent quality medical care with proper time for you to explain your problem, have an exam and receive treatment with instructions, these appointments should be limited to one new problem per visit. If you experience a new problem, in  which you desire to be addressed, please make an acute office visit, we save openings on the schedule to accommodate you. Please do not save your new problem for any other type of visit, let us take care of it properly and quickly for you.   Follow up visits:  Depending on your condition(s) your provider will need to see you routinely in order to provide you with quality care and prescribe medication(s). Most chronic conditions (Example: hypertension, Diabetes, depression/anxiety... etc), require visits a couple times a year. Your provider will instruct you on proper follow up for your personal medical conditions and history. Please make certain to make follow up appointments for your condition as instructed. Failing to do so could result in lapse in your medication treatment/refills. If you request a refill, and are overdue to be seen on a condition, we will always provide you with a 30 day script (once) to allow you time to schedule.    Medicare wellness (well visit): - we have a wonderful Nurse Maudie Mercury), that will meet with you and provide you will yearly medicare wellness visits. These visits should occur yearly (can not be scheduled less than 1 calendar year apart) and cover preventive health, immunizations, advance directives and screenings you are entitled to yearly through your medicare benefits. Do not miss out on your entitled benefits, this is when medicare will pay for these benefits to be ordered for you.  These are strongly encouraged by your provider and is the appropriate type of visit to make certain you are up to date with all preventive health benefits. If you have not had your medicare wellness exam in the last 12 months, please make certain to schedule one by calling the office and schedule your medicare wellness with Maudie Mercury as soon as possible.   Yearly physical (well visit):  - Adults are recommended to be seen yearly for physicals. Check with your insurance and date of your last physical,  most insurances require one calendar year between physicals. Physicals include all preventive health topics, screenings, medical exam and labs that are appropriate for gender/age and history. You may have fasting labs needed at this visit. This is a well visit (not a sick visit), new problems should not be covered during this visit (see acute visit).  - Pediatric patients are seen more frequently when they are younger. Your provider will advise you on well child visit timing that is appropriate for your their age. - This is not a medicare wellness visit. Medicare wellness exams do not have an exam portion to the visit. Some medicare companies allow for a physical, some do not allow a yearly physical. If your medicare allows a yearly physical you can schedule the medicare wellness with our nurse Maudie Mercury and have your physical with your provider after, on the same day. Please check with insurance for your full benefits.   Late Policy/No Shows:  - all new patients should arrive 15-30 minutes earlier than appointment to allow [place] time  to  obtain all personal demographics,  insurance information and for you to complete office paperwork. - All established patients should arrive 10-15 minutes earlier than appointment time to update all information and be checked in .  - In our best efforts to run on time, if you are late for your appointment you will be asked to either reschedule or if able, we will work you back into the schedule. There will be a wait time to work you back in the schedule,  depending on availability.  - If you are unable to make it to your appointment as scheduled, please call 24 hours ahead of time to allow [place] to fill the time slot with someone else who needs to be seen. If you do not cancel your appointment ahead of time, you may be charged a no show fee.

## 2017-08-10 ENCOUNTER — Telehealth: Payer: Self-pay | Admitting: Family Medicine

## 2017-08-10 NOTE — Telephone Encounter (Signed)
Message left on voice mail for patient to return call. Okay for PEC to give results and PCP recommendations.

## 2017-08-10 NOTE — Telephone Encounter (Signed)
Noted  

## 2017-08-10 NOTE — Telephone Encounter (Signed)
Pt given results per notes of Dr Claiborne BillingsKuneff on 08/09/17. Unable to document in result note due to [facility] Pt will pick up cards 08/14/17. Offered to make appointment for 3 month appt pt stated she doesnt have her calendar and will schedule appointment when she comes in to pick up the fecal occult cards  Please inform patient the following information: Her labs are all normal with the exception of new mild anemia, which is lower levels of hemoglobin/blood in her system. This can be caused by iron deficiency and/or loss of blood.  - most common source of loss of blood is via the colon.  - recommend she complete FOBT cards to look for blood in the stool. She can either pick up or we can send them. Order placed.  - F/U in 3 months provider appointment for repeat labs/iron panel to make sure she is not becoming anemic --> please schedule her.  - in the meantime she should try to eat foods higher in iron which can be found in meat, fish, spinach, broccoli and many types of seeds.

## 2017-08-10 NOTE — Telephone Encounter (Signed)
Done, see pec note.

## 2017-08-14 ENCOUNTER — Encounter: Payer: Self-pay | Admitting: Family Medicine

## 2017-08-23 ENCOUNTER — Telehealth: Payer: Self-pay | Admitting: *Deleted

## 2017-08-23 ENCOUNTER — Other Ambulatory Visit (INDEPENDENT_AMBULATORY_CARE_PROVIDER_SITE_OTHER): Payer: BC Managed Care – PPO

## 2017-08-23 DIAGNOSIS — D649 Anemia, unspecified: Secondary | ICD-10-CM

## 2017-08-23 DIAGNOSIS — Z1211 Encounter for screening for malignant neoplasm of colon: Secondary | ICD-10-CM

## 2017-08-23 NOTE — Telephone Encounter (Signed)
Diana Rios called and stated they needed an order re-entered patient is there with IFOB cards and they needed order put in also they accidentally release order for Hemoccult x3 and requested it be re entered.

## 2017-08-23 NOTE — Progress Notes (Unsigned)
hemmocult 

## 2017-08-24 ENCOUNTER — Telehealth: Payer: Self-pay | Admitting: Family Medicine

## 2017-08-24 LAB — FECAL OCCULT BLOOD, IMMUNOCHEMICAL: Fecal Occult Bld: NEGATIVE

## 2017-08-24 NOTE — Telephone Encounter (Signed)
Pt returned call for lab results  Copied from CRM [id number]. Topic: Quick Communication - Lab Results >> Aug 24, 2017  1:05 PM Regan Rakersay, Laura K, CMA wrote: Negative stool cards. Detailed message left on voice mail. Okay for pec nurse to give results. See lab note.

## 2017-08-25 NOTE — Telephone Encounter (Signed)
Left message to call if she had questions in regard to stool cards.

## 2017-09-06 LAB — HM DIABETES EYE EXAM

## 2017-09-07 ENCOUNTER — Encounter: Payer: Self-pay | Admitting: Family Medicine

## 2017-11-14 ENCOUNTER — Ambulatory Visit: Payer: BC Managed Care – PPO | Admitting: Family Medicine

## 2017-12-05 ENCOUNTER — Other Ambulatory Visit: Payer: Self-pay | Admitting: Family Medicine

## 2017-12-05 DIAGNOSIS — Z1231 Encounter for screening mammogram for malignant neoplasm of breast: Secondary | ICD-10-CM

## 2017-12-06 ENCOUNTER — Ambulatory Visit (HOSPITAL_BASED_OUTPATIENT_CLINIC_OR_DEPARTMENT_OTHER)
Admission: RE | Admit: 2017-12-06 | Discharge: 2017-12-06 | Disposition: A | Discharge status: Home / Self Care | Payer: BC Managed Care – PPO | Source: Ambulatory Visit | Attending: Family Medicine | Admitting: Family Medicine

## 2017-12-06 ENCOUNTER — Encounter: Payer: Self-pay | Admitting: Family Medicine

## 2017-12-06 DIAGNOSIS — R928 Other abnormal and inconclusive findings on diagnostic imaging of breast: Secondary | ICD-10-CM | POA: Insufficient documentation

## 2017-12-06 DIAGNOSIS — Z1231 Encounter for screening mammogram for malignant neoplasm of breast: Secondary | ICD-10-CM | POA: Insufficient documentation

## 2017-12-07 ENCOUNTER — Other Ambulatory Visit: Payer: Self-pay | Admitting: Family Medicine

## 2017-12-07 DIAGNOSIS — R928 Other abnormal and inconclusive findings on diagnostic imaging of breast: Secondary | ICD-10-CM

## 2017-12-11 ENCOUNTER — Ambulatory Visit: Payer: BC Managed Care – PPO

## 2017-12-11 ENCOUNTER — Ambulatory Visit
Admission: RE | Admit: 2017-12-11 | Discharge: 2017-12-11 | Disposition: A | Discharge status: Home / Self Care | Payer: BC Managed Care – PPO | Source: Ambulatory Visit | Attending: Family Medicine | Admitting: Family Medicine

## 2017-12-11 DIAGNOSIS — R928 Other abnormal and inconclusive findings on diagnostic imaging of breast: Secondary | ICD-10-CM

## 2017-12-20 ENCOUNTER — Encounter: Payer: Self-pay | Admitting: *Deleted

## 2018-02-06 ENCOUNTER — Ambulatory Visit: Payer: BC Managed Care – PPO | Admitting: Family Medicine

## 2018-02-08 ENCOUNTER — Other Ambulatory Visit: Payer: Self-pay | Admitting: Family Medicine

## 2018-02-08 DIAGNOSIS — F418 Other specified anxiety disorders: Secondary | ICD-10-CM

## 2018-02-08 NOTE — Telephone Encounter (Signed)
I received refill request for multiple medications for patient today.  I do not see where she has her follow-up appointment scheduled.   She is due for a follow-up appointment now.  It has been 6 months since she was seen for her chronic medical conditions.   Please call patient and have her schedule an appointment within 30 days for her chronic medical conditions.  We can refill her Cymbalta, Wellbutrin and lisinopril for 30 days to a local pharmacy to allow her enough time to schedule.

## 2018-02-08 NOTE — Telephone Encounter (Signed)
Spoke with pt and she states that she does not need a refill. Asked if she would like to make an appt for her follow up since she is due for that. She stated that she is in the car and not able to make an appt at the minute, but will call back once she gets home.

## 2018-03-08 ENCOUNTER — Other Ambulatory Visit: Payer: Self-pay | Admitting: Family Medicine

## 2018-03-08 DIAGNOSIS — F418 Other specified anxiety disorders: Secondary | ICD-10-CM

## 2018-03-08 MED ORDER — LISINOPRIL 40 MG PO TABS: 40.0000 mg | ORAL_TABLET | Freq: Every day | ORAL | 0 refills | Status: DC

## 2018-03-08 MED ORDER — BUPROPION HCL ER (XL) 300 MG PO TB24: 300.0000 mg | ORAL_TABLET | Freq: Every day | ORAL | 0 refills | Status: DC

## 2018-03-08 MED ORDER — DULOXETINE HCL 60 MG PO CPEP: 60.0000 mg | ORAL_CAPSULE | Freq: Every day | ORAL | 0 refills | Status: DC

## 2018-03-08 NOTE — Telephone Encounter (Signed)
Rx refilled for 30 days and appt made for Monday the 10th at 1:15p.

## 2018-03-08 NOTE — Telephone Encounter (Signed)
Please inform patient the following information: received a refill request for patients medications. She is due for her 6 mos follow up and I do not see an appt scheduled. Please schedule her a follow up.  We can send 30 days of lisinopril, cymbalta and wellbutrin to her local pharmacy if needed to bridge her to her appt (please send).

## 2018-03-08 NOTE — Telephone Encounter (Signed)
Spoke with pt was advised to sch an appt. Appt scheduled for Monday at 1:15p.

## 2018-03-12 ENCOUNTER — Ambulatory Visit: Payer: BC Managed Care – PPO | Admitting: Family Medicine

## 2018-03-12 ENCOUNTER — Encounter: Payer: Self-pay | Admitting: Family Medicine

## 2018-03-12 VITALS — BP 139/76 | HR 68 | Temp 98.1°F | Resp 16 | Ht 66.0 in | Wt 215.0 lb

## 2018-03-12 DIAGNOSIS — I10 Essential (primary) hypertension: Secondary | ICD-10-CM | POA: Diagnosis not present

## 2018-03-12 DIAGNOSIS — E669 Obesity, unspecified: Secondary | ICD-10-CM | POA: Diagnosis not present

## 2018-03-12 DIAGNOSIS — E119 Type 2 diabetes mellitus without complications: Secondary | ICD-10-CM

## 2018-03-12 DIAGNOSIS — F418 Other specified anxiety disorders: Secondary | ICD-10-CM | POA: Diagnosis not present

## 2018-03-12 LAB — POCT GLYCOSYLATED HEMOGLOBIN (HGB A1C)
HBA1C, POC (PREDIABETIC RANGE): 5.6 % — AB (ref 5.7–6.4)
HbA1c POC (<> result, manual entry): 5.6 % (ref 4.0–5.6)
HbA1c, POC (controlled diabetic range): 5.6 % (ref 0.0–7.0)
Hemoglobin A1C: 5.6 % (ref 4.0–5.6)

## 2018-03-12 MED ORDER — BUPROPION HCL ER (XL) 300 MG PO TB24: 300.0000 mg | ORAL_TABLET | Freq: Every day | ORAL | 1 refills | Status: DC

## 2018-03-12 MED ORDER — LISINOPRIL 40 MG PO TABS: 40.0000 mg | ORAL_TABLET | Freq: Every day | ORAL | 1 refills | Status: DC

## 2018-03-12 MED ORDER — DULOXETINE HCL 60 MG PO CPEP: 60.0000 mg | ORAL_CAPSULE | Freq: Every day | ORAL | 1 refills | Status: DC

## 2018-03-12 NOTE — Patient Instructions (Signed)
I have refilled your meds.  Makes sure to watch your BP- make sure it stays below 140/85 - if above will need followup sooner for additional medications.  Follow up in 6 months for medical conditions.   dont forget to make physicals yearly.   Please help us help you:  We are honored [facility] have [facility]. Below you will find basic instructions that you may need to access in the future. Please help us help you by reading the instructions, which cover many of the frequent questions we experience.   Prescription refills and request:  -In order to allow more efficient response time, please call your pharmacy for all refills. They will forward the request electronically to us. This allows for the quickest possible response. Request left on a nurse line can take longer to refill, since these are checked as time allows between office patients and other phone calls.  - refill request can take up to 3-5 working days to complete.  - If request is sent electronically and request is appropiate, it is usually completed in 1-2 business days.  - all patients will need to be seen routinely for all chronic medical conditions requiring prescription medications (see follow-up below). If you are overdue for follow up on your condition, you will be asked to make an appointment and we will call in enough medication to cover you until your appointment (up to 30 days).  - all controlled substances will require a face to face visit to request/refill.  - if you desire your prescriptions to go through a new pharmacy, and have an active script at original pharmacy, you will need to call your pharmacy and have scripts transferred to new pharmacy. This is completed between the pharmacy locations and not by your provider.    Results: If any images or labs were ordered, it can take up to 1 week to get results depending on the test ordered and the lab/facility running and resulting the  test. - Normal or stable results, which do not need further discussion, may be released to your mychart immediately with attached note to you. A call may not be generated for normal results. Please make certain to sign up for mychart. If you have questions on how to activate your mychart you can call the front office.  - If your results need further discussion, our office will attempt to contact you via phone, and if unable to reach you after 2 attempts, we will release your abnormal result to your mychart with instructions.  - All results will be automatically released in mychart after 1 week.  - Your provider will provide you with explanation and instruction on all relevant material in your results. Please keep in mind, results and labs may appear confusing or abnormal to the untrained eye, but it does not mean they are actually abnormal for you personally. If you have any questions about your results that are not covered, or you desire more detailed explanation than what was provided, you should make an appointment with your provider to do so.   Our office handles many outgoing and incoming calls daily. If we have not contacted you within 1 week about your results, please check your mychart to see if there is a message first and if not, then contact our office.  In helping with this matter, you help decrease call volume, and therefore allow us to be able to respond to patients needs more efficiently.  Acute office visits (sick visit):  An acute visit is intended for a new problem and are scheduled in shorter time slots to allow schedule openings for patients with new problems. This is the appropriate visit to discuss a new problem. Problems will not be addressed by phone call or Echart message. Appointment is needed if requesting treatment. In order to provide you with excellent quality medical care with proper time for you to explain your problem, have an exam and receive treatment with instructions,  these appointments should be limited to one new problem per visit. If you experience a new problem, in which you desire to be addressed, please make an acute office visit, we save openings on the schedule to accommodate you. Please do not save your new problem for any other type of visit, let us take care of it properly and quickly for you.   Follow up visits:  Depending on your condition(s) your provider will need to see you routinely in order to provide you with quality care and prescribe medication(s). Most chronic conditions (Example: hypertension, Diabetes, depression/anxiety... etc), require visits a couple times a year. Your provider will instruct you on proper follow up for your personal medical conditions and history. Please make certain to make follow up appointments for your condition as instructed. Failing to do so could result in lapse in your medication treatment/refills. If you request a refill, and are overdue to be seen on a condition, we will always provide you with a 30 day script (once) to allow you time to schedule.    Medicare wellness (well visit): - we have a wonderful Nurse Selena Batten(Kim), that will meet with you and provide you will yearly medicare wellness visits. These visits should occur yearly (can not be scheduled less than 1 calendar year apart) and cover preventive health, immunizations, advance directives and screenings you are entitled to yearly through your medicare benefits. Do not miss out on your entitled benefits, this is when medicare will pay for these benefits to be ordered for you.  These are strongly encouraged by your provider and is the appropriate type of visit to make certain you are up to date with all preventive health benefits. If you have not had your medicare wellness exam in the last 12 months, please make certain to schedule one by calling the office and schedule your medicare wellness with Selena BattenKim as soon as possible.   Yearly physical (well visit):  - Adults are  recommended to be seen yearly for physicals. Check with your insurance and date of your last physical, most insurances require one calendar year between physicals. Physicals include all preventive health topics, screenings, medical exam and labs that are appropriate for gender/age and history. You may have fasting labs needed at this visit. This is a well visit (not a sick visit), new problems should not be covered during this visit (see acute visit).  - Pediatric patients are seen more frequently when they are younger. Your provider will advise you on well child visit timing that is appropriate for your their age. - This is not a medicare wellness visit. Medicare wellness exams do not have an exam portion to the visit. Some medicare companies allow for a physical, some do not allow a yearly physical. If your medicare allows a yearly physical you can schedule the medicare wellness with our nurse Selena BattenKim and have your physical with your provider after, on the same day. Please check with insurance for your full benefits.   Late Policy/No Shows:  -  all new patients should arrive 15-30 minutes earlier than appointment to allow us time  to  obtain all personal demographics,  insurance information and for you to complete office paperwork. - All established patients should arrive 10-15 minutes earlier than appointment time to update all information and be checked in .  - In our best efforts to run on time, if you are late for your appointment you will be asked to either reschedule or if able, we will work you back into the schedule. There will be a wait time to work you back in the schedule,  depending on availability.  - If you are unable to make it to your appointment as scheduled, please call 24 hours ahead of time to allow us to fill the time slot with someone else who needs to be seen. If you do not cancel your appointment ahead of time, you may be charged a no show fee.

## 2018-03-12 NOTE — Progress Notes (Signed)
Patient ID: Diana LuzLorrie Schwoerer, female  DOB: 06/05/1955, 63 y.o.   MRN: [id number] Patient Care Team    Relationship Specialty Notifications Start End  Natalia LeatherwoodKuneff, Wojciech Willetts A, DO PCP - General Family Medicine  11/20/14   Jeananne RamaEllington, Terry, DO  Optometry  03/02/16    Comment: [facility] [place]    Subjective:  Diana Rios is a 63 y.o.  Female  present for diabetes follow up.   Diabetes Type 2 :  Patient had stopped taking the metformin a few months ago. She does continue to watch her diet closely and continues to slowly lose weight over time intentionally. She had attended the  PREP program classes. She does not monitor her blood sugars.Patient denies dizziness, hyperglycemic or hypoglycemic events. Patient denies numbness, tingling in the extremities or nonhealing wounds of feet.  - A1c: 6.2--> 5.7 --> 5.9--> 5.7 - BMP normal 08/09/2017 - foot exam: completed 08/09/2017 - eye exam: 08/2015, [facility] in [place], encouraged to follow yearly. --> referral placed today - PNA series: prevnar 13 03/02/2016, Pneumovax due 01/2017- patient reports she will get next visit. - microalbumin: on Acei  Depression with anxiety: Patient reports she is doing well on Wellbutrin 300 mg daily and Cymbalta 60 mg daily. She reports no break in her therapy and has continued to medications daily. Pt has suffered with depression and anxiety most of her life. She has some difficulty dealing with the health of her father (alzeihmer disease.  She's excited about an upcoming cruise to the [place]  Hypertension/morbid obesity: Pt reports compliance with lisinopril 40 mg QD. She watches her diet closely, and has been exercising. She continues to intentionally lose weight. She eats a low-salt diet. Patient denies chest pain, shortness of breath, dizziness or lower extremity edema.  BMP:08/09/2017 within normal limits Lipid: 08/09/2017 total cholesterol 209, LDL 122, HDL 62, triglycerides 122 CBC:  08/09/2017 mildly low  hemoglobin at 11.7 TSH: 08/09/2017 TSH 1.81 RF: Hypertension, diabetes, obesity, family history of heart disease  Depression screen [facility] 2/9 03/12/2018 08/09/2017 12/28/2016 12/02/2015  Decreased Interest 0 0 0 0  Down, Depressed, Hopeless 0 0 0 0  PHQ - 2 Score 0 0 0 0  Altered sleeping 0 0 0 -  Tired, decreased energy 1 0 2 -  Change in appetite 1 0 1 -  Feeling bad or failure about yourself  1 0 0 -  Trouble concentrating 0 0 0 -  Moving slowly or fidgety/restless 0 0 0 -  Suicidal thoughts 0 0 0 -  PHQ-9 Score 3 0 3 -  Difficult doing work/chores - Not difficult at all Not difficult at all -   GAD 7 : Generalized Anxiety Score 03/12/2018  Nervous, Anxious, on Edge 0  Control/stop worrying 0  Worry too much - different things 0  Trouble relaxing 0  Restless 0  Easily annoyed or irritable 0  Afraid - awful might happen 0  Total GAD 7 Score 0  Anxiety Difficulty Not difficult at all    Immunization History  Administered Date(s) Administered  . Influenza Split 11/04/2015  . Influenza,inj,Quad PF,6+ Mos 11/19/2016, 11/29/2017  . Influenza-Unspecified 10/04/2012, 11/14/2013, 10/19/2014, 11/04/2015  . Pneumococcal Conjugate-13 03/02/2016  . Tdap 06/01/2011, 10/04/2012  . Zoster 11/04/2015    Past Medical History:  Diagnosis Date  . Chicken pox   . Depression   . Diet-controlled diabetes mellitus (HCC)   . Hypertension    Allergies  Allergen Reactions  . Codeine Itching  . Penicillins Itching  Past Surgical History:  Procedure Laterality Date  . BREAST BIOPSY Left    Family History  Problem Relation Age of Onset  . Heart disease Mother [id number]       premature  . Diabetes Mother   . Hypertension Father   . Heart disease Sister        heart defect  . Diabetes Daughter   . Cancer Paternal Grandmother    Social History   Socioeconomic History  . Marital status: Married    Spouse name: Not on file  . Number of children: 2  . Years of education: [employer]  .  Highest education level: Not on file  Occupational History  . Occupation: Geologist, engineeringTeacher Assistant    Comment: Retired  Engineer, productionocial Needs  . Financial resource strain: Not on file  . Food insecurity:    Worry: Not on file    Inability: Not on file  . Transportation needs:    Medical: Not on file    Non-medical: Not on file  Tobacco Use  . Smoking status: Never Smoker  . Smokeless tobacco: Never Used  Substance and Sexual Activity  . Alcohol use: No    Alcohol/week: 0.0 standard drinks  . Drug use: No  . Sexual activity: Yes    Birth control/protection: Post-menopausal  Lifestyle  . Physical activity:    Days per week: Not on file    Minutes per session: Not on file  . Stress: Not on file  Relationships  . Social connections:    Talks on phone: Not on file    Gets together: Not on file    Attends religious service: Not on file    Active member of club or organization: Not on file    Attends meetings of clubs or organizations: Not on file    Relationship status: Not on file  . Intimate partner violence:    Fear of current or ex partner: Not on file    Emotionally abused: Not on file    Physically abused: Not on file    Forced sexual activity: Not on file  Other Topics Concern  . Not on file  Social History Narrative   [name] relocated from [place], [place]. She lived in [place], [place] for short while. 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   Allergies as of 03/12/2018      Reactions   Codeine Itching   Penicillins Itching      Medication List       Accurate as of March 12, 2018 11:59 PM. Always use your most recent med list.        buPROPion 300 MG 24 hr tablet Commonly known as:  WELLBUTRIN XL Take 1 tablet (300 mg total) by mouth daily.   DULoxetine 60 MG capsule Commonly known as:  CYMBALTA Take 1 capsule (60 mg total) by mouth daily.   lisinopril 40 MG tablet Commonly known as:  PRINIVIL,ZESTRIL Take 1 tablet (40 mg total) by mouth  daily.   multivitamin capsule Take 1 capsule by mouth daily.        Recent Results (from the past 2160 hour(s))  POCT HgB A1C     Status: Abnormal   Collection Time: 03/12/18  1:57 PM  Result Value Ref Range   Hemoglobin A1C 5.6 4.0 - 5.6 %   HbA1c POC (<> result, manual entry) 5.6 4.0 - 5.6 %   HbA1c, POC (prediabetic range) 5.6 (A) 5.7 - 6.4 %   HbA1c, POC (controlled  diabetic range) 5.6 0.0 - 7.0 %     ROS: 14 pt review of systems performed and negative (unless mentioned in an HPI)  Objective: BP 139/76 (BP Location: Right Arm, Patient Position: Sitting, Cuff Size: Large)   Pulse 68   Temp 98.1 F (36.7 C) (Oral)   Resp 16   Ht 5\' 6"  (1.676 m)   Wt 215 lb (97.5 kg)   SpO2 99%   BMI 34.70 kg/m   Gen: Afebrile. No acute distress.  Toxic and presentation.  Well-developed, well-nourished, obese Caucasian female. HENT: AT. [place]. MMM.  Eyes:Pupils Equal Round Reactive to light, Extraocular movements intact,  Conjunctiva without redness, discharge or icterus. Neck/lymp/endocrine: Supple, no lymphadenopathy, no thyromegaly CV: RRR no murmur, no edema, +2/4 P posterior tibialis pulses Chest: CTAB, no wheeze or crackles Abd: Soft. NTND. BS present Skin: No rashes, purpura or petechiae.  Warm well perfused intact Neuro: Normal gait. PERLA. EOMi. Alert. Oriented.  Psych: Normal affect, dress and demeanor. Normal speech. Normal thought content and judgment.     No results found for this or any previous visit (from the past 24 hour(s)).  Assessment/plan: Diana Rios is a 63 y.o. female present for CPE . type 2 diabetes mellitus (HCC)/obesity - A1c 5.9--> 5.7 --> 5.6 today, without metformin. Discussed that she is diet controlled and as long as A1c stays below 6.3 she should not need medications.  -Remove diabetes from her present list.  She has been well controlled without medications.  Follow A1c yearly only. - Continue diet and exercise regimen - PNA series: prevnar 13  03/02/2016, Pneumovax duept wants to postpone again. patient agreed to have completed next visit.  Depression, unspecified depression type - Stable. - Patient is tolerating Wellbutrin and Cymbalta.   - f/u 6 mos  Essential hypertension, benign - Stable. Continue lisinopril 40 mg daily.  Refills provided today.   -Patient encouraged to monitor her blood pressure at home if routinely above 140 systolic she needs to be seen sooner. - low sodium diet - continue exercise.  - F/U routinely every 6 months  Return in about 6 months (around 09/10/2018) for HTN'.  Patient encouraged to schedule yearly physical.  Electronically signed by: Felix Pacini, DO [facility]

## 2018-03-13 ENCOUNTER — Encounter: Payer: Self-pay | Admitting: Family Medicine

## 2018-10-22 ENCOUNTER — Other Ambulatory Visit: Payer: Self-pay | Admitting: Family Medicine

## 2018-10-22 DIAGNOSIS — I10 Essential (primary) hypertension: Secondary | ICD-10-CM

## 2018-10-22 DIAGNOSIS — F418 Other specified anxiety disorders: Secondary | ICD-10-CM

## 2018-10-22 NOTE — Telephone Encounter (Signed)
Please call patient. I have received refill request and pt is due for 6 mos follow up. Please schedule ASAP.  I did go ahead and give her one refill to allow her time to get into her follow up appt.

## 2018-10-22 NOTE — Telephone Encounter (Signed)
Left detailed message on patient's phone to call office to scheduled 6 month follow up at earliest convenience.

## 2018-12-31 ENCOUNTER — Other Ambulatory Visit: Payer: Self-pay | Admitting: Family Medicine

## 2018-12-31 DIAGNOSIS — Z1231 Encounter for screening mammogram for malignant neoplasm of breast: Secondary | ICD-10-CM

## 2019-01-07 ENCOUNTER — Encounter: Payer: Self-pay | Admitting: Family Medicine

## 2019-01-07 ENCOUNTER — Other Ambulatory Visit: Payer: Self-pay

## 2019-01-07 ENCOUNTER — Ambulatory Visit (INDEPENDENT_AMBULATORY_CARE_PROVIDER_SITE_OTHER): Payer: BC Managed Care – PPO | Admitting: Family Medicine

## 2019-01-07 ENCOUNTER — Other Ambulatory Visit: Payer: Self-pay | Admitting: Family Medicine

## 2019-01-07 VITALS — Ht 66.0 in | Wt 210.0 lb

## 2019-01-07 DIAGNOSIS — I10 Essential (primary) hypertension: Secondary | ICD-10-CM

## 2019-01-07 DIAGNOSIS — E669 Obesity, unspecified: Secondary | ICD-10-CM | POA: Diagnosis not present

## 2019-01-07 DIAGNOSIS — F418 Other specified anxiety disorders: Secondary | ICD-10-CM | POA: Diagnosis not present

## 2019-01-07 DIAGNOSIS — E559 Vitamin D deficiency, unspecified: Secondary | ICD-10-CM | POA: Diagnosis not present

## 2019-01-07 DIAGNOSIS — R7309 Other abnormal glucose: Secondary | ICD-10-CM | POA: Insufficient documentation

## 2019-01-07 MED ORDER — BLOOD PRESSURE MONITOR/L CUFF MISC: 0 refills | Status: DC

## 2019-01-07 NOTE — Progress Notes (Signed)
VIRTUAL VISIT VIA VIDEO  I connected with Diana Rios on 01/07/19 at  1:30 PM EST by a video enabled telemedicine application and verified that I am speaking with the correct person using two identifiers. Location patient: Home Location provider: [facility], Office Persons participating in the virtual visit: Patient, Dr. Raoul Pitch and R.Baker, LPN  I discussed the limitations of evaluation and management by telemedicine and the availability of in person appointments. The patient expressed understanding and agreed to proceed.  Patient ID: Diana Rios, female  DOB: 03/16/1955, 63 y.o.   MRN: [id number] Patient Care Team    Relationship Specialty Notifications Start End  Ma Hillock, DO PCP - General Family Medicine  11/20/14   Herminio Commons, DO  Optometry  03/02/16    Comment: 45- Jule Ser   Chief Complaint  Patient presents with  . Anxiety    Doing well with no concerns. Does not take BP at home.   . Depression  . Hypertension    Subjective:  Diana Rios is a 63 y.o.  Female  present for diabetes follow up.   Diabetes Type 2 :  Patient had stopped taking the metformin a few months ago. She does continue to watch her diet closely and continues to slowly lose weight over time intentionally. She had attended the  PREP program classes. She does not monitor her blood sugars.Patient denies dizziness, hyperglycemic or hypoglycemic events. Patient denies numbness, tingling in the extremities or nonhealing wounds of feet.  - A1c: 6.2--> 5.7 --> 5.9--> 5.7 - BMP normal 08/09/2017 - foot exam: completed 08/09/2017 - eye exam: 08/2015, [facility] in [place], encouraged to follow yearly. --> referral placed today - PNA series: prevnar 13 03/02/2016, Pneumovax due 01/2017- patient reports she will get next visit. - microalbumin: on Acei  Depression with anxiety: Patient reports she is doing well on Wellbutrin 300 mg daily and Cymbalta 60 mg daily. She reports no  break in her therapy and has continued to medications daily. Pt has suffered with depression and anxiety most of her life. She has some difficulty dealing with the health of her father (alzeihmer disease.  She's excited about an upcoming cruise to [place]  Hypertension/morbid obesity: Pt reports compliance with lisinopril 40 mg QD. She watches her diet closely, and has been exercising. She continues to intentionally lose weight. She eats a low-salt diet. Patient denies chest pain, shortness of breath, dizziness or lower extremity edema.  BMP:08/09/2017 within normal limits Lipid: 08/09/2017 total cholesterol 209, LDL 122, HDL 62, triglycerides 122 CBC:  08/09/2017 mildly low hemoglobin at 11.7 TSH: 08/09/2017 TSH 1.81 RF: Hypertension, diabetes, obesity, family history of heart disease  Depression screen [facility] 2/9 01/07/2019 03/12/2018 08/09/2017 12/28/2016 12/02/2015  Decreased Interest 0 0 0 0 0  Down, Depressed, Hopeless 1 0 0 0 0  PHQ - 2 Score 1 0 0 0 0  Altered sleeping 0 0 0 0 -  Tired, decreased energy 0 1 0 2 -  Change in appetite 0 1 0 1 -  Feeling bad or failure about yourself  1 1 0 0 -  Trouble concentrating 0 0 0 0 -  Moving slowly or fidgety/restless 0 0 0 0 -  Suicidal thoughts 0 0 0 0 -  PHQ-9 Score 2 3 0 3 -  Difficult doing work/chores Not difficult at all - Not difficult at all Not difficult at all -   GAD 7 : Generalized Anxiety Score 01/07/2019 03/12/2018  Nervous, Anxious, on  Edge 1 0  Control/stop worrying 1 0  Worry too much - different things 0 0  Trouble relaxing 0 0  Restless 0 0  Easily annoyed or irritable 0 0  Afraid - awful might happen 0 0  Total GAD 7 Score 2 0  Anxiety Difficulty Not difficult at all Not difficult at all    Immunization History  Administered Date(s) Administered  . Influenza Split 11/04/2015  . Influenza,inj,Quad PF,6+ Mos 11/19/2016, 11/29/2017  . Influenza-Unspecified 10/04/2012, 11/14/2013, 10/19/2014, 11/04/2015  .  Pneumococcal Conjugate-13 03/02/2016  . Tdap 06/01/2011, 10/04/2012  . Zoster 11/04/2015    Past Medical History:  Diagnosis Date  . Chicken pox   . Depression   . Diet-controlled diabetes mellitus ([place])   . Hypertension    Allergies  Allergen Reactions  . Codeine Itching  . Penicillins Itching   Past Surgical History:  Procedure Laterality Date  . BREAST BIOPSY Left    Family History  Problem Relation Age of Onset  . Heart disease Mother 72       premature  . Diabetes Mother   . Hypertension Father   . Heart disease Sister        heart defect  . Diabetes Daughter   . Cancer Paternal Grandmother    Social History   Socioeconomic History  . Marital status: Married    Spouse name: Not on file  . Number of children: 2  . Years of education: [employer]  . Highest education level: Not on file  Occupational History  . 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 Financial resource strain: Not on file  . Food insecurity    Worry: Not on file    Inability: Not on file  . Transportation needs    Medical: Not on file    Non-medical: Not on file  Tobacco Use  . Smoking status: Never Smoker  . Smokeless tobacco: Never Used  Substance and Sexual Activity  . Alcohol use: No    Alcohol/week: 0.0 standard drinks  . Drug use: No  . Sexual activity: Yes    Birth control/protection: Post-menopausal  Lifestyle  . Physical activity    Days per week: Not on file    Minutes per session: Not on file  . Stress: Not on file  Relationships  . Social Herbalist on phone: Not on file    Gets together: Not on file    Attends religious service: Not on file    Active member of club or organization: Not on file    Attends meetings of clubs or organizations: Not on file    Relationship status: Not on file  . Intimate partner violence    Fear of current or ex partner: Not on file    Emotionally abused: Not on file    Physically abused: Not on  file    Forced sexual activity: Not on file  Other Topics Concern  . Not on file  Social History Narrative   [name] relocated from [place], [place]. She lived in [place], [place] for short while. 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   Allergies as of 01/07/2019      Reactions   Codeine Itching   Penicillins Itching      Medication List       Accurate as of January 07, 2019  1:40 PM. If you have any questions, ask your nurse or doctor.  buPROPion 300 MG 24 hr tablet Commonly known as: WELLBUTRIN XL TAKE 1 TABLET DAILY. NEED  AN APPOINTMENT FOR FUTURE  REFILLS.   DULoxetine 60 MG capsule Commonly known as: CYMBALTA TAKE 1 CAPSULE DAILY   lisinopril 40 MG tablet Commonly known as: ZESTRIL Take 1 tablet (40 mg total) by mouth daily.   multivitamin capsule Take 1 capsule by mouth daily.        No results found for this or any previous visit (from the past 2160 hour(s)).   ROS: 14 pt review of systems performed and negative (unless mentioned in an HPI)  Objective: Ht '5\' 6"'$  (1.676 m)   Wt 210 lb (95.3 kg)   BMI 33.89 kg/m   Gen: Afebrile. No acute distress.  Nontoxic in appearance.  Well-developed, well-nourished, very pleasant Caucasian, obese female. HENT: AT. [place].  Eyes:Pupils Equal Round Reactive to light, Extraocular movements intact,  Conjunctiva without redness, discharge or icterus. CV: No edema Chest: No cough or shortness of breath Neuro:  PERLA. EOMi. Alert. Oriented x3  Psych: Normal affect, dress and demeanor. Normal speech. Normal thought content and judgment.   No results found for this or any previous visit (from the past 24 hour(s)).  Assessment/plan: Keah Lamba is a 63 y.o. female present for CPE . Elevated glucose/obesity - A1c 5.9--> 5.7 --> 5.6 >> Future order placed today, without metformin. Discussed that she is diet controlled and as long as A1c stays below 6.3 she should not need medications.  -Remove  diabetes from her present list> elevated glucose.  She has been well controlled without medications.   - Continue diet and exercise regimen - PNA series: prevnar 13 03/02/2016, Pneumovax due>> can wait to after 64 series will need to be repeated and she has has to postpone.  Depression, unspecified depression type -Stable -Coping well with the pandemic. -Patient reports she is doing well on her Cymbalta and Wellbutrin.  She does need refills today.  Refills provided. - f/u 6 mos  Essential hypertension, benign -Presumed stable.  Vital signs will be collected during her lab appointment.  -Pressure cuff also ordered for her to monitor at home 3 times a week with a goal less than 130/80.  -  Continue lisinopril 40 mg daily.  Refills provided today - low sodium diet - continue exercise.  - F/U routinely every 6 months  Follow-up in 6 months on chronic medical conditions  Orders Placed This Encounter  Procedures  . Vitamin D (25 hydroxy)  . CBC w/Diff  . TSH  . Comp Met (CMET)  . Lipid panel  . Hemoglobin A1c  . Vital signs - notify MD    > 25 minutes spent with patient, >50% of time spent face to face     Electronically signed by: Howard Pouch, [place]

## 2019-01-11 ENCOUNTER — Ambulatory Visit
Admission: RE | Admit: 2019-01-11 | Discharge: 2019-01-11 | Disposition: A | Discharge status: Home / Self Care | Payer: BC Managed Care – PPO | Source: Ambulatory Visit | Attending: Family Medicine | Admitting: Family Medicine

## 2019-01-11 ENCOUNTER — Other Ambulatory Visit: Payer: Self-pay

## 2019-01-11 DIAGNOSIS — Z1231 Encounter for screening mammogram for malignant neoplasm of breast: Secondary | ICD-10-CM

## 2019-01-14 ENCOUNTER — Other Ambulatory Visit: Payer: Self-pay

## 2019-01-14 ENCOUNTER — Ambulatory Visit (INDEPENDENT_AMBULATORY_CARE_PROVIDER_SITE_OTHER): Payer: BC Managed Care – PPO | Admitting: Family Medicine

## 2019-01-14 ENCOUNTER — Encounter: Payer: Self-pay | Admitting: Family Medicine

## 2019-01-14 ENCOUNTER — Other Ambulatory Visit: Payer: Self-pay | Admitting: Family Medicine

## 2019-01-14 DIAGNOSIS — F418 Other specified anxiety disorders: Secondary | ICD-10-CM

## 2019-01-14 DIAGNOSIS — E559 Vitamin D deficiency, unspecified: Secondary | ICD-10-CM | POA: Diagnosis not present

## 2019-01-14 DIAGNOSIS — I10 Essential (primary) hypertension: Secondary | ICD-10-CM | POA: Diagnosis not present

## 2019-01-14 DIAGNOSIS — R928 Other abnormal and inconclusive findings on diagnostic imaging of breast: Secondary | ICD-10-CM

## 2019-01-14 DIAGNOSIS — R7309 Other abnormal glucose: Secondary | ICD-10-CM | POA: Diagnosis not present

## 2019-01-14 MED ORDER — LISINOPRIL 40 MG PO TABS: 40.0000 mg | ORAL_TABLET | Freq: Every day | ORAL | 1 refills | Status: DC

## 2019-01-14 MED ORDER — DULOXETINE HCL 60 MG PO CPEP: 60.0000 mg | ORAL_CAPSULE | Freq: Every day | ORAL | 1 refills | Status: DC

## 2019-01-14 MED ORDER — BUPROPION HCL ER (XL) 300 MG PO TB24: ORAL_TABLET | ORAL | 1 refills | Status: DC

## 2019-01-14 NOTE — Addendum Note (Signed)
Addended by: Howard Pouch A on: 01/14/2019 04:46 PM   Modules accepted: Orders, Level of Service

## 2019-01-14 NOTE — Progress Notes (Addendum)
Diana Rios is a 63 y.o. female presents to the office today for Blood pressure recheck secondary to VV 01/07/2019 Blood pressure medication: lisinopril 40mg  QDIf on medication, Last dose was at least 1-2 hours prior to recheck: Yes Blood pressure was taken in the left arm after patient rested for 10 minutes.  BP 130/80 (BP Location: Left Arm, Patient Position: Sitting, Cuff Size: Normal)   Pulse 75   Temp 98.7 F (37.1 C) (Temporal)   Resp 16   SpO2 95%   Kirat Mezquita, CMA  ________________________________ BP looks good. Continue current regimen.  Refills called into her mail-in pharmacy on  all medications

## 2019-01-15 ENCOUNTER — Ambulatory Visit
Admission: RE | Admit: 2019-01-15 | Discharge: 2019-01-15 | Disposition: A | Discharge status: Home / Self Care | Payer: BC Managed Care – PPO | Source: Ambulatory Visit | Attending: Family Medicine | Admitting: Family Medicine

## 2019-01-15 ENCOUNTER — Telehealth: Payer: Self-pay | Admitting: Family Medicine

## 2019-01-15 DIAGNOSIS — R928 Other abnormal and inconclusive findings on diagnostic imaging of breast: Secondary | ICD-10-CM

## 2019-01-15 LAB — CBC WITH DIFFERENTIAL/PLATELET
Absolute Monocytes: 250 cells/uL (ref 200–950)
Basophils Absolute: 20 cells/uL (ref 0–200)
Basophils Relative: 0.5 %
Eosinophils Absolute: 109 cells/uL (ref 15–500)
Eosinophils Relative: 2.8 %
HCT: 37.8 % (ref 35.0–45.0)
Hemoglobin: 12 g/dL (ref 11.7–15.5)
Lymphs Abs: 1022 cells/uL (ref 850–3900)
MCH: 26 pg — ABNORMAL LOW (ref 27.0–33.0)
MCHC: 31.7 g/dL — ABNORMAL LOW (ref 32.0–36.0)
MCV: 82 fL (ref 80.0–100.0)
MPV: 12 fL (ref 7.5–12.5)
Monocytes Relative: 6.4 %
Neutro Abs: 2500 cells/uL (ref 1500–7800)
Neutrophils Relative %: 64.1 %
Platelets: 318 10*3/uL (ref 140–400)
RBC: 4.61 10*6/uL (ref 3.80–5.10)
RDW: 12.8 % (ref 11.0–15.0)
Total Lymphocyte: 26.2 %
WBC: 3.9 10*3/uL (ref 3.8–10.8)

## 2019-01-15 LAB — COMPREHENSIVE METABOLIC PANEL
AG Ratio: 1.5 (calc) (ref 1.0–2.5)
ALT: 15 U/L (ref 6–29)
AST: 19 U/L (ref 10–35)
Albumin: 4 g/dL (ref 3.6–5.1)
Alkaline phosphatase (APISO): 89 U/L (ref 37–153)
BUN: 12 mg/dL (ref 7–25)
CO2: 25 mmol/L (ref 20–32)
Calcium: 8.6 mg/dL (ref 8.6–10.4)
Chloride: 106 mmol/L (ref 98–110)
Creat: 0.93 mg/dL (ref 0.50–0.99)
Globulin: 2.7 g/dL (calc) (ref 1.9–3.7)
Glucose, Bld: 119 mg/dL — ABNORMAL HIGH (ref 65–99)
Potassium: 4.3 mmol/L (ref 3.5–5.3)
Sodium: 142 mmol/L (ref 135–146)
Total Bilirubin: 0.5 mg/dL (ref 0.2–1.2)
Total Protein: 6.7 g/dL (ref 6.1–8.1)

## 2019-01-15 LAB — VITAMIN D 25 HYDROXY (VIT D DEFICIENCY, FRACTURES): Vit D, 25-Hydroxy: 20 ng/mL — ABNORMAL LOW (ref 30–100)

## 2019-01-15 LAB — LIPID PANEL
Cholesterol: 192 mg/dL (ref <200)
HDL: 60 mg/dL (ref >=50)
LDL Cholesterol (Calc): 115 mg/dL (calc) — ABNORMAL HIGH
Non-HDL Cholesterol (Calc): 132 mg/dL (calc) — ABNORMAL HIGH (ref <130)
Total CHOL/HDL Ratio: 3.2 (calc) (ref <5.0)
Triglycerides: 76 mg/dL (ref <150)

## 2019-01-15 LAB — TSH: TSH: 2.11 mIU/L (ref 0.40–4.50)

## 2019-01-15 LAB — HEMOGLOBIN A1C
Hgb A1c MFr Bld: 5.9 % of total Hgb — ABNORMAL HIGH (ref <5.7)
Mean Plasma Glucose: 123 (calc)
eAG (mmol/L): 6.8 (calc)

## 2019-01-15 MED ORDER — VITAMIN D (ERGOCALCIFEROL) 1.25 MG (50000 UNIT) PO CAPS: 50000.0000 [IU] | ORAL_CAPSULE | ORAL | 0 refills | Status: DC

## 2019-01-15 NOTE — Telephone Encounter (Signed)
Please inform patient the following information: -Her liver, kidney, electrolytes, cholesterol, blood counts and thyroid function are all in normal range. - her a1c/diabetes screen went up a fraction from 5.6 to 5.9.  Her fasting glucose was mildly elevated at 119.  This is just mildly elevated and not in the diabetic range.  Increasing her exercise and avoiding high carbohydrate content meals/snacks will help her avoid increasing her A1c and advancing to diabetes. -Her vitamin D is deficient at a level of 20.  Normal is over 30 and ideally level should be at least between 40-50 to maintain good bone health.>>  I have called in the once weekly high-dose vitamin D supplement to boost her levels to normal range.  This should be taken with food for better absorption.  I would also recommend she takes over-the-counter vitamin D 1000 units daily starting now and continuing indefinitely.  Follow up 5.5 mos. Please schedule if not already scheduled.

## 2019-01-15 NOTE — Progress Notes (Signed)
Pt was give information, she verbalized understanding

## 2019-01-15 NOTE — Telephone Encounter (Signed)
Pt was called and given information/instructions, she verbalized understanding Pt refused to schedule F/U appt at this time

## 2019-01-22 ENCOUNTER — Telehealth: Payer: Self-pay | Admitting: Family Medicine

## 2019-01-22 DIAGNOSIS — I10 Essential (primary) hypertension: Secondary | ICD-10-CM

## 2019-01-22 MED ORDER — LISINOPRIL 40 MG PO TABS: 40.0000 mg | ORAL_TABLET | Freq: Every day | ORAL | 0 refills | Status: DC

## 2019-01-22 NOTE — Telephone Encounter (Signed)
Patient was notified by [facility] that her prescription would be delayed in arriving. She is running low and will be out of Blood pressure meds before they will arrive via mail.  Patient request to call in a week or 2 weeks worth of meds of blood pressure meds so that she does not run out of meds.   lisinopril (ZESTRIL) 40 MG tablet [[id number]   To [facility] (please make sure this does not go to mail order, which is her preferred pharmacy)   Thank you

## 2019-01-22 NOTE — Telephone Encounter (Signed)
2 week supply was sent to local pharmacy. Pt was called and VM left letting pt know medication was sent

## 2019-03-12 ENCOUNTER — Ambulatory Visit: Payer: Self-pay | Attending: Internal Medicine

## 2019-03-12 DIAGNOSIS — Z23 Encounter for immunization: Secondary | ICD-10-CM | POA: Insufficient documentation

## 2019-03-12 NOTE — Progress Notes (Signed)
   Covid-19 Vaccination Clinic  Name:  Hooria Gasparini    MRN: [id number] DOB: 1955/12/05  03/12/2019  Ms. Zaun was observed post Covid-19 immunization for 15 minutes without incidence. She was provided with Vaccine Information Sheet and instruction to access the V-Safe system.   Ms. Mehl was instructed to call 911 with any severe reactions post vaccine: Marland Kitchen Difficulty breathing  . Swelling of your face and throat  . A fast heartbeat  . A bad rash all over your body  . Dizziness and weakness    Immunizations Administered    Name Date Dose VIS Date Route   Pfizer COVID-19 Vaccine 03/12/2019 11:32 AM 0.3 mL 01/11/2019 Intramuscular   Manufacturer: [employer], [employer]   Lot: [id number]   NDC: [address]-6

## 2019-03-14 ENCOUNTER — Ambulatory Visit: Payer: BC Managed Care – PPO

## 2019-04-06 ENCOUNTER — Ambulatory Visit: Payer: Medicare PPO | Attending: Internal Medicine

## 2019-04-06 DIAGNOSIS — Z23 Encounter for immunization: Secondary | ICD-10-CM

## 2019-04-06 NOTE — Progress Notes (Signed)
   Covid-19 Vaccination Clinic  Name:  Diana Rios    MRN: [id number] DOB: 12/27/1955  04/06/2019  [name] was observed post Covid-19 immunization for 15 minutes without incident. She was provided with Vaccine Information Sheet and instruction to access the V-Safe system.   [name] was instructed to call 911 with any severe reactions post vaccine: Marland Kitchen Difficulty breathing  . Swelling of face and throat  . A fast heartbeat  . A bad rash all over body  . Dizziness and weakness   Immunizations Administered    Name Date Dose VIS Date Route   Pfizer COVID-19 Vaccine 04/06/2019  8:21 AM 0.3 mL 01/11/2019 Intramuscular   Manufacturer: [employer], [employer]   Lot: [id number]   NDC: [address]-2

## 2019-06-03 ENCOUNTER — Encounter: Payer: Self-pay | Admitting: Family Medicine

## 2019-06-04 ENCOUNTER — Encounter: Payer: Self-pay | Admitting: Family Medicine

## 2019-06-04 DIAGNOSIS — I10 Essential (primary) hypertension: Secondary | ICD-10-CM

## 2019-06-04 MED ORDER — LISINOPRIL 40 MG PO TABS: 40.0000 mg | ORAL_TABLET | Freq: Every day | ORAL | 0 refills | Status: DC

## 2019-06-25 ENCOUNTER — Other Ambulatory Visit: Payer: Self-pay | Admitting: Family Medicine

## 2019-06-25 DIAGNOSIS — I10 Essential (primary) hypertension: Secondary | ICD-10-CM

## 2019-07-05 ENCOUNTER — Ambulatory Visit: Payer: Medicare PPO | Admitting: Family Medicine

## 2019-07-11 ENCOUNTER — Ambulatory Visit (INDEPENDENT_AMBULATORY_CARE_PROVIDER_SITE_OTHER): Payer: Medicare PPO | Admitting: Family Medicine

## 2019-07-11 ENCOUNTER — Encounter: Payer: Self-pay | Admitting: Family Medicine

## 2019-07-11 ENCOUNTER — Other Ambulatory Visit: Payer: Self-pay

## 2019-07-11 VITALS — BP 120/75 | HR 69 | Temp 98.0°F | Resp 17 | Ht 66.0 in | Wt 221.0 lb

## 2019-07-11 DIAGNOSIS — R7309 Other abnormal glucose: Secondary | ICD-10-CM | POA: Diagnosis not present

## 2019-07-11 DIAGNOSIS — I10 Essential (primary) hypertension: Secondary | ICD-10-CM

## 2019-07-11 DIAGNOSIS — E559 Vitamin D deficiency, unspecified: Secondary | ICD-10-CM

## 2019-07-11 DIAGNOSIS — F418 Other specified anxiety disorders: Secondary | ICD-10-CM | POA: Diagnosis not present

## 2019-07-11 DIAGNOSIS — E669 Obesity, unspecified: Secondary | ICD-10-CM | POA: Diagnosis not present

## 2019-07-11 LAB — VITAMIN D 25 HYDROXY (VIT D DEFICIENCY, FRACTURES): VITD: 22.03 ng/mL — ABNORMAL LOW (ref 30.00–100.00)

## 2019-07-11 MED ORDER — DULOXETINE HCL 60 MG PO CPEP: 60.0000 mg | ORAL_CAPSULE | Freq: Every day | ORAL | 1 refills | Status: DC

## 2019-07-11 MED ORDER — LISINOPRIL 40 MG PO TABS: 40.0000 mg | ORAL_TABLET | Freq: Every day | ORAL | 1 refills | Status: DC

## 2019-07-11 MED ORDER — BUPROPION HCL ER (XL) 300 MG PO TB24: ORAL_TABLET | ORAL | 1 refills | Status: DC

## 2019-07-11 NOTE — Progress Notes (Signed)
Patient ID: Diana Rios, female  DOB: [birth year], 64 y.o.   MRN: [id number] Patient Care Team    Relationship Specialty Notifications Start End  Natalia Leatherwood, DO PCP - General Family Medicine  11/20/14   Jeananne Rama, DO  Optometry  03/02/16    Comment: [facility]   Chief Complaint  Patient presents with   Medication Refill    Subjective:  Diana Rios is a 64 y.o.  Female  present for [facility] follow up Elevated glucose/diet controlled: Metformin was able to be discontinued about 6 months ago secondary to very well controlled A1c.  She reports today she has not been watching her diet as well.  She has joined Geophysicist/field seismologist now and feels that is going to be helpful getting her back on track. - A1c: 6.2--> 5.7 --> 5.9--> 5.7>5.9 (off med) - PNA series: prevnar 13 03/02/2016, Pneumovax due 01/2017-patient has declined Pneumovax, but reports she may eventually get it. - microalbumin: on Acei  Depression with anxiety: Patient reports she is doing fairly well on Wellbutrin 300 mg daily and Cymbalta 60 mg daily.  Her father passed away the end of 06/30/22.  She feels she is coping well considering.  Pt has suffered with depression and anxiety most of her life.  Hypertension/morbid obesity: Pt reports compliance with lisinopril 40 mg QD. Patient denies chest pain, shortness of breath, dizziness or lower extremity edema.  Labs up-to-date RF: Hypertension, diabetes, obesity, family history of heart disease  Vitamin D deficiency: Patient's last vitamin D level was 20.  She was supplemented with high-dose vitamin D for 12 weeks.  She then started an over-the-counter vitamin D supplement.  She is uncertain the dose of her vitamin D supplement.  Depression screen [facility] 2/9 07/11/2019 01/07/2019 03/12/2018 08/09/2017 12/28/2016  Decreased Interest 0 0 0 0 0  Down, Depressed, Hopeless 1 1 0 0 0  PHQ - 2 Score 1 1 0 0 0  Altered sleeping 1 0 0 0 0  Tired, decreased energy 1 0 1 0  2  Change in appetite 2 0 1 0 1  Feeling bad or failure about yourself  1 1 1  0 0  Trouble concentrating 0 0 0 0 0  Moving slowly or fidgety/restless 0 0 0 0 0  Suicidal thoughts 0 0 0 0 0  PHQ-9 Score 6 2 3  0 3  Difficult doing work/chores Somewhat difficult Not difficult at all - Not difficult at all Not difficult at all   GAD 7 : Generalized Anxiety Score 01/07/2019 03/12/2018  Nervous, Anxious, on Edge 1 0  Control/stop worrying 1 0  Worry too much - different things 0 0  Trouble relaxing 0 0  Restless 0 0  Easily annoyed or irritable 0 0  Afraid - awful might happen 0 0  Total GAD 7 Score 2 0  Anxiety Difficulty Not difficult at all Not difficult at all    Immunization History  Administered Date(s) Administered   Influenza Split 11/04/2015   Influenza, Quadrivalent, Recombinant, Inj, Pf 10/27/2018   Influenza,inj,Quad PF,6+ Mos 11/19/2016, 11/29/2017   Influenza-Unspecified 10/04/2012, 11/14/2013, 10/19/2014, 11/04/2015   PFIZER SARS-COV-2 Vaccination 03/12/2019, 04/06/2019   Pneumococcal Conjugate-13 03/02/2016   Tdap 06/01/2011, 10/04/2012   Zoster 11/04/2015    Past Medical History:  Diagnosis Date   Chicken pox    Depression    Diet-controlled diabetes mellitus ([facility])    Hypertension    Allergies  Allergen Reactions   Codeine Itching   Penicillins  Itching   Past Surgical History:  Procedure Laterality Date   BREAST BIOPSY Left    Family History  Problem Relation Age of Onset   Heart disease Mother 75       premature   Diabetes Mother    Hypertension Father    Heart disease Sister        heart defect   Diabetes Daughter    Cancer Paternal Grandmother    Social History   Socioeconomic History   Marital status: Married    Spouse name: Not on file   Number of children: 2   Years of education: [employer] colleg   Highest education level: Not on file  Occupational History   Occupation: Geologist, engineering    Comment: Retired   Tobacco Use   Smoking status: Never Smoker   Smokeless tobacco: Never Used  Building services engineer Use: Never used  Substance and Sexual Activity   Alcohol use: No    Alcohol/week: 0.0 standard drinks   Drug use: No   Sexual activity: Yes    Birth control/protection: Post-menopausal  Other Topics Concern   Not on file  Social History Narrative   Diana Rios relocated from [place], [place]. She lived in [place], [place] for short while. 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   Social Determinants of Health   Financial Resource Strain:    Difficulty of Paying Living Expenses:   Food Insecurity:    Worried About Programme researcher, broadcasting/film/video in the Last Year:    Barista in the Last Year:   Transportation Needs:    Freight forwarder (Medical):    Lack of Transportation (Non-Medical):   Physical Activity:    Days of Exercise per Week:    Minutes of Exercise per Session:   Stress:    Feeling of Stress :   Social Connections:    Frequency of Communication with Friends and Family:    Frequency of Social Gatherings with Friends and Family:    Attends Religious Services:    Active Member of Clubs or Organizations:    Attends Banker Meetings:    Marital Status:   Intimate Partner Violence:    Fear of Current or Ex-Partner:    Emotionally Abused:    Physically Abused:    Sexually Abused:    Allergies as of 07/11/2019      Reactions   Codeine Itching   Penicillins Itching      Medication List       Accurate as of July 11, 2019 12:22 PM. If you have any questions, ask your nurse or doctor.        Blood Pressure Monitor/L Cuff Misc Monitor BP x3 weekly.   buPROPion 300 MG 24 hr tablet Commonly known as: WELLBUTRIN XL TAKE 1 TABLET DAILY.   DULoxetine 60 MG capsule Commonly known as: CYMBALTA Take 1 capsule (60 mg total) by mouth daily.   lisinopril 40 MG tablet Commonly known as: ZESTRIL Take 1 tablet (40 mg  total) by mouth daily.   multivitamin capsule Take 1 capsule by mouth daily.   Vitamin D (Ergocalciferol) 1.25 MG (50000 UNIT) Caps capsule Commonly known as: DRISDOL Take 1 capsule (50,000 Units total) by mouth every 7 (seven) days.        No results found for this or any previous visit (from the past 2160 hour(s)).   ROS: 14 pt review of systems performed and negative (unless mentioned in an HPI)  Objective: BP 120/75 (BP Location: Left Arm, Patient Position: Sitting, Cuff Size: Large)    Pulse 69    Temp 98 F (36.7 C) (Temporal)    Resp 17    Ht 5\' 6"  (1.676 m)    Wt 221 lb (100.2 kg)    SpO2 97%    BMI 35.67 kg/m   Gen: Afebrile. No acute distress.  Nontoxic in appearance, well-developed, well-nourished, very pleasant female. HENT: AT. [place].  No cough or shortness of breath Eyes:Pupils Equal Round Reactive to light, Extraocular movements intact,  Conjunctiva without redness, discharge or icterus. Neck/lymp/endocrine: Supple, no lymphadenopathy, no thyromegaly CV: RRR no murmur, no edema, +2/4 P posterior tibialis pulses Chest: CTAB, no wheeze or crackles Skin: No rashes, purpura or petechiae.  Neuro:  Normal gait. PERLA. EOMi. Alert. Oriented x3  Psych: Normal affect, dress and demeanor. Normal speech. Normal thought content and judgment.  No results found for this or any previous visit (from the past 24 hour(s)).  Assessment/plan: Diana Rios is a 64 y.o. female present for. Elevated glucose/obesity - A1c 5.9--> 5.7 --> 5.6 >>5.9 > ordered today.  Discussed that she is diet controlled and as long as A1c stays below 6.3 she should not need medications.  -Remove diabetes from her present list> elevated glucose.  She has been well controlled without medications.   - Continue diet and exercise regimen - PNA series: prevnar 13 03/02/2016, Pneumovax due>> can wait to after 64 series will need to be repeated and she has has to postpone.  Depression, unspecified depression  type -Stable.  Recent passing of her father at the end of May 2021.  Obvious grief, but coping well. -Continue Cymbalta  -Continue Wellbutrin.     Essential hypertension, benign/obesity -Stable. -  goal less than 130/80.  -Continue lisinopril 40 mg daily.  - low sodium diet - continue exercise.   Vitamin D deficiency: Recheck levels today after high-dose supplementation followed by OTC daily supplementation.  Patient will check  her vitamin D dose when she gets home.  Discussed the need for adequate levels of vitamin D to maintain healthy bones.   Follow-up in 5.5 months on chronic medical conditions, sooner if needed  Orders Placed This Encounter  Procedures   Hemoglobin A1c   Vitamin D (25 hydroxy)   Meds ordered this encounter  Medications   buPROPion (WELLBUTRIN XL) 300 MG 24 hr tablet    Sig: TAKE 1 TABLET DAILY.    Dispense:  90 tablet    Refill:  1    Please do not send request for renewal or a year supply. If pt needs refills more than prescribed,it is time for patients follow up with the provider and they should call the office for an appt. Thanks   DULoxetine (CYMBALTA) 60 MG capsule    Sig: Take 1 capsule (60 mg total) by mouth daily.    Dispense:  90 capsule    Refill:  1    Please do not send request for renewal or a year supply. If pt needs refills more than prescribed,it is time for patients follow up with the provider and they should call the office for an appt. Thanks   lisinopril (ZESTRIL) 40 MG tablet    Sig: Take 1 tablet (40 mg total) by mouth daily.    Dispense:  90 tablet    Refill:  1    Please do not send request for renewal or a year supply. If pt needs refills more than prescribed,it is  time for patients follow up with the provider and they should call the office for an appt. Thanks   Referral Orders  No referral(s) requested today        Electronically signed by: Howard Pouch, [place]

## 2019-07-11 NOTE — Patient Instructions (Signed)
Great to see you today.  I have refilled your meds for you.  Glad you joined silver sneakers.   Next appt: End of November.

## 2019-07-12 LAB — HEMOGLOBIN A1C: Hgb A1c MFr Bld: 6.4 % (ref 4.6–6.5)

## 2019-07-14 ENCOUNTER — Encounter: Payer: Self-pay | Admitting: Family Medicine

## 2019-07-16 ENCOUNTER — Ambulatory Visit: Payer: Medicare PPO | Admitting: Family Medicine

## 2019-09-09 ENCOUNTER — Encounter (INDEPENDENT_AMBULATORY_CARE_PROVIDER_SITE_OTHER): Payer: Medicare PPO | Admitting: Ophthalmology

## 2019-11-14 DIAGNOSIS — Z03818 Encounter for observation for suspected exposure to other biological agents ruled out: Secondary | ICD-10-CM | POA: Diagnosis not present

## 2019-11-14 DIAGNOSIS — Z20822 Contact with and (suspected) exposure to covid-19: Secondary | ICD-10-CM | POA: Diagnosis not present

## 2019-12-09 ENCOUNTER — Other Ambulatory Visit: Payer: Self-pay | Admitting: Family Medicine

## 2019-12-09 DIAGNOSIS — Z1231 Encounter for screening mammogram for malignant neoplasm of breast: Secondary | ICD-10-CM

## 2019-12-18 ENCOUNTER — Other Ambulatory Visit: Payer: Self-pay | Admitting: Family Medicine

## 2019-12-18 DIAGNOSIS — I10 Essential (primary) hypertension: Secondary | ICD-10-CM

## 2019-12-18 DIAGNOSIS — F418 Other specified anxiety disorders: Secondary | ICD-10-CM

## 2020-01-10 ENCOUNTER — Other Ambulatory Visit: Payer: Self-pay

## 2020-01-10 ENCOUNTER — Ambulatory Visit (INDEPENDENT_AMBULATORY_CARE_PROVIDER_SITE_OTHER): Payer: Medicare PPO | Admitting: Family Medicine

## 2020-01-10 ENCOUNTER — Encounter: Payer: Self-pay | Admitting: Family Medicine

## 2020-01-10 VITALS — BP 123/75 | HR 73 | Temp 98.5°F | Ht 66.25 in | Wt 212.0 lb

## 2020-01-10 DIAGNOSIS — E559 Vitamin D deficiency, unspecified: Secondary | ICD-10-CM

## 2020-01-10 DIAGNOSIS — I10 Essential (primary) hypertension: Secondary | ICD-10-CM

## 2020-01-10 DIAGNOSIS — F418 Other specified anxiety disorders: Secondary | ICD-10-CM

## 2020-01-10 DIAGNOSIS — Z7185 Encounter for immunization safety counseling: Secondary | ICD-10-CM | POA: Diagnosis not present

## 2020-01-10 DIAGNOSIS — Z23 Encounter for immunization: Secondary | ICD-10-CM

## 2020-01-10 DIAGNOSIS — R7309 Other abnormal glucose: Secondary | ICD-10-CM | POA: Diagnosis not present

## 2020-01-10 LAB — COMPREHENSIVE METABOLIC PANEL
ALT: 17 U/L (ref 0–35)
AST: 18 U/L (ref 0–37)
Albumin: 4.2 g/dL (ref 3.5–5.2)
Alkaline Phosphatase: 91 U/L (ref 39–117)
BUN: 16 mg/dL (ref 6–23)
CO2: 25 mEq/L (ref 19–32)
Calcium: 9.2 mg/dL (ref 8.4–10.5)
Chloride: 105 mEq/L (ref 96–112)
Creatinine, Ser: 1.01 mg/dL (ref 0.40–1.20)
GFR: 58.29 mL/min — ABNORMAL LOW (ref >60.00)
Glucose, Bld: 100 mg/dL — ABNORMAL HIGH (ref 70–99)
Potassium: 4.5 mEq/L (ref 3.5–5.1)
Sodium: 138 mEq/L (ref 135–145)
Total Bilirubin: 0.4 mg/dL (ref 0.2–1.2)
Total Protein: 7 g/dL (ref 6.0–8.3)

## 2020-01-10 LAB — CBC
HCT: 38.4 % (ref 36.0–46.0)
Hemoglobin: 11.9 g/dL — ABNORMAL LOW (ref 12.0–15.0)
MCHC: 31 g/dL (ref 30.0–36.0)
MCV: 78.2 fl (ref 78.0–100.0)
Platelets: 316 10*3/uL (ref 150.0–400.0)
RBC: 4.92 Mil/uL (ref 3.87–5.11)
RDW: 15.5 % (ref 11.5–15.5)
WBC: 3.6 10*3/uL — ABNORMAL LOW (ref 4.0–10.5)

## 2020-01-10 LAB — LIPID PANEL
Cholesterol: 212 mg/dL — ABNORMAL HIGH (ref 0–200)
HDL: 57.4 mg/dL (ref >39.00)
LDL Cholesterol: 133 mg/dL — ABNORMAL HIGH (ref 0–99)
NonHDL: 154.15
Total CHOL/HDL Ratio: 4
Triglycerides: 108 mg/dL (ref 0.0–149.0)
VLDL: 21.6 mg/dL (ref 0.0–40.0)

## 2020-01-10 LAB — POCT GLYCOSYLATED HEMOGLOBIN (HGB A1C)
HbA1c POC (<> result, manual entry): 5.6 % (ref 4.0–5.6)
HbA1c, POC (controlled diabetic range): 5.6 % (ref 0.0–7.0)
HbA1c, POC (prediabetic range): 5.6 % — AB (ref 5.7–6.4)
Hemoglobin A1C: 5.6 % (ref 4.0–5.6)

## 2020-01-10 LAB — TSH: TSH: 2.17 u[IU]/mL (ref 0.35–4.50)

## 2020-01-10 LAB — VITAMIN D 25 HYDROXY (VIT D DEFICIENCY, FRACTURES): VITD: 18.81 ng/mL — ABNORMAL LOW (ref 30.00–100.00)

## 2020-01-10 MED ORDER — LISINOPRIL 40 MG PO TABS: 40.0000 mg | ORAL_TABLET | Freq: Every day | ORAL | 1 refills | Status: DC

## 2020-01-10 MED ORDER — DULOXETINE HCL 60 MG PO CPEP: 60.0000 mg | ORAL_CAPSULE | Freq: Every day | ORAL | 1 refills | Status: DC

## 2020-01-10 MED ORDER — BUPROPION HCL ER (XL) 300 MG PO TB24: ORAL_TABLET | ORAL | 1 refills | Status: DC

## 2020-01-10 NOTE — Progress Notes (Signed)
Patient ID: Diana Rios, female  DOB: 05/17/1955, 64 y.o.   MRN: [id number] Patient Care Team    Relationship Specialty Notifications Start End  Ma Hillock, DO PCP - General Family Medicine  11/20/14   Herminio Commons, DO  Optometry  03/02/16    Comment: 47- Jule Ser   Chief Complaint  Patient presents with  . Follow-up    CMC; pt is fasting    Subjective: Diana Rios is a 64 y.o.  Female  present for [facility] follow up Elevated glucose/diet controlled: Metformin was able to be discontinued 2020.  - A1c: 6.2--> 5.7 --> 5.9--> 5.7>5.9 >6.4> 5.6 (off med) - PNA series: completed today - microalbumin: on Acei  Depression with anxiety: Patient reports she is doing well on Wellbutrin 300 mg daily and Cymbalta 60 mg daily.  Her father passed away the end of 14-Jun-2022.  She feels she is coping well considering.  Pt has suffered with depression and anxiety most of her life.  Hypertension/morbid obesity: Pt reports compliance with lisinopril 40 mg QD. Patient denies chest pain, shortness of breath, dizziness or lower extremity edema.   RF: Hypertension, diabetes, obesity, family history of heart disease  Vitamin D deficiency: Patient's last vitamin D level was 20>22.  She was supplemented with high-dose vitamin D for 12 weeks.  She then started an over-the-counter vitamin D supplement.  She is uncertain the dose of her vitamin D supplement.  Depression screen [facility] 2/9 01/10/2020 07/11/2019 01/07/2019 03/12/2018 08/09/2017  Decreased Interest 0 0 0 0 0  Down, Depressed, Hopeless 0 1 1 0 0  PHQ - 2 Score 0 1 1 0 0  Altered sleeping - 1 0 0 0  Tired, decreased energy - 1 0 1 0  Change in appetite - 2 0 1 0  Feeling bad or failure about yourself  - $'1 1 1 'G$ 0  Trouble concentrating - 0 0 0 0  Moving slowly or fidgety/restless - 0 0 0 0  Suicidal thoughts - 0 0 0 0  PHQ-9 Score - $Remov'6 2 3 'fFyWGR$ 0  Difficult doing work/chores - Somewhat difficult Not difficult at all - Not difficult at  all   GAD 7 : Generalized Anxiety Score 01/07/2019 03/12/2018  Nervous, Anxious, on Edge 1 0  Control/stop worrying 1 0  Worry too much - different things 0 0  Trouble relaxing 0 0  Restless 0 0  Easily annoyed or irritable 0 0  Afraid - awful might happen 0 0  Total GAD 7 Score 2 0  Anxiety Difficulty Not difficult at all Not difficult at all    Immunization History  Administered Date(s) Administered  . Influenza Split 11/04/2015  . Influenza, Quadrivalent, Recombinant, Inj, Pf 10/27/2018  . Influenza,inj,Quad PF,6+ Mos 11/19/2016, 11/29/2017  . Influenza-Unspecified 10/04/2012, 11/14/2013, 10/19/2014, 11/04/2015, 10/31/2019  . PFIZER SARS-COV-2 Vaccination 03/12/2019, 04/06/2019, 11/05/2019  . Pneumococcal Conjugate-13 03/02/2016  . Pneumococcal Polysaccharide-23 01/10/2020  . Tdap 06/01/2011, 10/04/2012  . Zoster 11/04/2015    Past Medical History:  Diagnosis Date  . Chicken pox   . Depression   . Diet-controlled diabetes mellitus ([place])   . Hypertension    Allergies  Allergen Reactions  . Codeine Itching  . Penicillins Itching   Past Surgical History:  Procedure Laterality Date  . BREAST BIOPSY Left    Family History  Problem Relation Age of Onset  . Heart disease Mother 33       premature  . Diabetes Mother   .  Hypertension Father   . Heart disease Sister        heart defect  . Diabetes Daughter   . Cancer Paternal Grandmother    Social History   Socioeconomic History  . Marital status: Married    Spouse name: Not on file  . Number of children: 2  . Years of education: [employer]  . Highest education level: Not on file  Occupational History  . Occupation: Control and instrumentation engineer    Comment: Retired  Tobacco Use  . Smoking status: Never Smoker  . Smokeless tobacco: Never Used  Vaping Use  . Vaping Use: Never used  Substance and Sexual Activity  . Alcohol use: No    Alcohol/week: 0.0 standard drinks  . Drug use: No  . Sexual activity: Yes    Birth  control/protection: Post-menopausal  Other Topics Concern  . Not on file  Social History Narrative   Diana Rios relocated from [place], [place]. She lived in [place], [place] for short while. 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   Social Determinants of Health   Financial Resource Strain: Not on file  Food Insecurity: Not on file  Transportation Needs: Not on file  Physical Activity: Not on file  Stress: Not on file  Social Connections: Not on file  Intimate Partner Violence: Not on file   Allergies as of 01/10/2020      Reactions   Codeine Itching   Penicillins Itching      Medication List       Accurate as of January 10, 2020 10:14 AM. If you have any questions, ask your nurse or doctor.        STOP taking these medications   Vitamin D (Ergocalciferol) 1.25 MG (50000 UNIT) Caps capsule Commonly known as: DRISDOL Stopped by: Howard Pouch, DO     TAKE these medications   Blood Pressure Monitor/L Cuff Misc Monitor BP x3 weekly.   buPROPion 300 MG 24 hr tablet Commonly known as: WELLBUTRIN XL TAKE 1 TABLET EVERY DAY   DULoxetine 60 MG capsule Commonly known as: CYMBALTA Take 1 capsule (60 mg total) by mouth daily.   lisinopril 40 MG tablet Commonly known as: ZESTRIL Take 1 tablet (40 mg total) by mouth daily.   multivitamin capsule Take 1 capsule by mouth daily.        Recent Results (from the past 2160 hour(s))  POCT HgB A1C     Status: Abnormal   Collection Time: 01/10/20 10:13 AM  Result Value Ref Range   Hemoglobin A1C 5.6 4.0 - 5.6 %   HbA1c POC (<> result, manual entry) 5.6 4.0 - 5.6 %   HbA1c, POC (prediabetic range) 5.6 (A) 5.7 - 6.4 %   HbA1c, POC (controlled diabetic range) 5.6 0.0 - 7.0 %     ROS: 14 pt review of systems performed and negative (unless mentioned in an HPI)  Objective: BP 123/75   Pulse 73   Temp 98.5 F (36.9 C) (Oral)   Ht 5' 6.25" (1.683 m)   Wt 212 lb (96.2 kg)   SpO2 96%   BMI 33.96 kg/m    Gen: Afebrile. No acute distress. Nontoxic pleasant obese female.  HENT: AT. [place] Eyes:Pupils Equal Round Reactive to light, Extraocular movements intact,  Conjunctiva without redness, discharge or icterus. Neck/lymp/endocrine: Supple,no lymphadenopathy, no thyromegaly CV: RRR no murmur, no edema, +2/4 P posterior tibialis pulses Chest: CTAB, no wheeze or crackles  Neuro:  Normal gait. PERLA. EOMi. Alert. Oriented x3 Psych:  Normal affect, dress and demeanor. Normal speech. Normal thought content and judgment.  Results for orders placed or performed in visit on 01/10/20 (from the past 24 hour(s))  POCT HgB A1C     Status: Abnormal   Collection Time: 01/10/20 10:13 AM  Result Value Ref Range   Hemoglobin A1C 5.6 4.0 - 5.6 %   HbA1c POC (<> result, manual entry) 5.6 4.0 - 5.6 %   HbA1c, POC (prediabetic range) 5.6 (A) 5.7 - 6.4 %   HbA1c, POC (controlled diabetic range) 5.6 0.0 - 7.0 %    Assessment/plan: Diana Rios is a 64 y.o. female present for cmc Elevated glucose/obesity - A1c 5.9--> 5.7 --> 5.6 >>5.9 >6.4> A1c collected today>5.6 Discussed that she is diet controlled and as long as A1c stays below 6.3 she should not need medications.  -Remove diabetes from her present list> elevated glucose.  She has been well controlled without medications.   - Continue diet and exercise regimen - PNA series: prevnar 13 03/02/2016, Pneumovax due>> completed today.  Depression, unspecified depression type -stable.  Recent passing of her father at the end ofJune 2021.  Obvious grief, but coping well. -continue  Cymbalta  -continue  Wellbutrin.    Essential hypertension, benign/obesity - stable.  -  goal less than 130/80.  - continue  lisinopril 40 mg daily.  - low sodium diet - continue exercise.   Vitamin D deficiency: Forgot to continue supplement.   Need for pneumococcal vaccination Completed today Vaccine counseling Pt encourage to schedule covid booster shot   Follow-up in  5.5 months on chronic medical conditions, sooner if needed  Orders Placed This Encounter  Procedures  . Pneumococcal polysaccharide vaccine 23-valent greater than or equal to 2yo subcutaneous/IM  . CBC  . Comp Met (CMET)  . TSH  . Lipid panel  . Vitamin D (25 hydroxy)  . POCT HgB A1C   Meds ordered this encounter  Medications  . lisinopril (ZESTRIL) 40 MG tablet    Sig: Take 1 tablet (40 mg total) by mouth daily.    Dispense:  90 tablet    Refill:  1  . DULoxetine (CYMBALTA) 60 MG capsule    Sig: Take 1 capsule (60 mg total) by mouth daily.    Dispense:  90 capsule    Refill:  1  . buPROPion (WELLBUTRIN XL) 300 MG 24 hr tablet    Sig: TAKE 1 TABLET EVERY DAY    Dispense:  90 tablet    Refill:  1   Referral Orders  No referral(s) requested today        Electronically signed by: Howard Pouch, [place]

## 2020-01-10 NOTE — Patient Instructions (Addendum)
    A1c today: looks great at 5.6 Your BP looks great!  Next appt: 5.5 mos- end of MAY  You will need to restart Vit D supplement everyday forever! We guide you on proper dose once we get your lab result back.    If you are age 65 or older, your body mass index should be between 23-30. Your Body mass index is Body mass index is 33.96 kg/m.Marland Kitchen If this is above the aforementioned range listed, you are consider obese by medical definition and standards. Routine daily exercise and dietary modifications are encouraged. If you would like additional guidance on weight loss, please make an appointment for weight loss counseling - must be an appointment dedicate to weight loss counseling alone. I would be happy to help you.   If you are age 47 or younger, your body mass index should be between 19-25. Your Body mass index is Body mass index is 33.96 kg/m. Marland Kitchen If this is above the aformentioned range listed, you are consider obese by medical definition and standards. Routine daily exercise and dietary modifications are encouraged. If you would like additional guidance on weight loss, please make an appointment for weight loss counseling - must be an appointment dedicate to weight loss counseling alone. I would be happy to help you.

## 2020-01-11 ENCOUNTER — Telehealth: Payer: Self-pay | Admitting: Family Medicine

## 2020-01-11 DIAGNOSIS — E785 Hyperlipidemia, unspecified: Secondary | ICD-10-CM | POA: Insufficient documentation

## 2020-01-11 MED ORDER — ROSUVASTATIN CALCIUM 20 MG PO TABS: 20.0000 mg | ORAL_TABLET | Freq: Every day | ORAL | 3 refills | Status: DC

## 2020-01-11 NOTE — Telephone Encounter (Signed)
Please call patient -Liver, electrolytes and thyroid levels are normal -Kidney function is stable -Blood cell counts are stable -Cholesterol panel is over goal for her.  Her goal would be less than an LDL of 100.on the basis of her age, history of elevated glucose and hypertension her cardiovascular risk for heart attack, heart disease or stroke is approximately 20% within the next 10 years.   Her LDL currently is 133 and it was 115 last year.     -Increasing exercise, increasing fiber in the diet and avoiding saturated and transfats are helpful in lowering LDL.  A Mediterranean Mediterranean/heart healthy diet is recommended. By American Heart Association criteria, she should be started on a statin medication to provide her cardiovascular protection.  -I have called in this medication to her mail order pharmacy for her to start.  She should follow-up in 3 months with provider for repeat fasting cholesterol panel after starting statin regimen to ensure she is at goal.

## 2020-01-11 NOTE — Telephone Encounter (Signed)
Unable to LVM. VM full

## 2020-01-13 MED ORDER — VITAMIN D (ERGOCALCIFEROL) 1.25 MG (50000 UNIT) PO CAPS: 50000.0000 [IU] | ORAL_CAPSULE | ORAL | 0 refills | Status: DC

## 2020-01-13 NOTE — Addendum Note (Signed)
Addended by: Felix Pacini A on: 01/13/2020 12:34 PM   Modules accepted: Orders

## 2020-01-13 NOTE — Telephone Encounter (Signed)
Please advise on Vit D dosage

## 2020-01-13 NOTE — Telephone Encounter (Signed)
Routing to team pool

## 2020-01-13 NOTE — Telephone Encounter (Signed)
Attempted to contact pt and could not leave VM  

## 2020-01-13 NOTE — Telephone Encounter (Signed)
My apologies.  I have called in once weekly high dose to [facility] for 12 weeks.  She also needs to start 2000u OTC daily and continue even after prescribed is completed.   Thanks.

## 2020-01-13 NOTE — Telephone Encounter (Signed)
Spoke with pt regarding labs and instructions.   

## 2020-01-16 ENCOUNTER — Other Ambulatory Visit: Payer: Self-pay

## 2020-01-16 ENCOUNTER — Ambulatory Visit
Admission: RE | Admit: 2020-01-16 | Discharge: 2020-01-16 | Disposition: A | Discharge status: Home / Self Care | Payer: Medicare PPO | Source: Ambulatory Visit | Attending: Family Medicine | Admitting: Family Medicine

## 2020-01-16 DIAGNOSIS — Z1231 Encounter for screening mammogram for malignant neoplasm of breast: Secondary | ICD-10-CM | POA: Diagnosis not present

## 2020-03-05 DIAGNOSIS — Z03818 Encounter for observation for suspected exposure to other biological agents ruled out: Secondary | ICD-10-CM | POA: Diagnosis not present

## 2020-03-05 DIAGNOSIS — Z20822 Contact with and (suspected) exposure to covid-19: Secondary | ICD-10-CM | POA: Diagnosis not present

## 2020-03-23 ENCOUNTER — Telehealth: Payer: Self-pay | Admitting: Family Medicine

## 2020-03-23 NOTE — Telephone Encounter (Signed)
Spoke with patient she state she will call back to scheduled AWV

## 2020-04-08 ENCOUNTER — Telehealth: Payer: Self-pay | Admitting: Family Medicine

## 2020-04-08 NOTE — Telephone Encounter (Signed)
Attempted to schedule AWV. Unable to LVM.  Will try at later time. Patient mail box full.   Called patient to schedule Annual Wellness Visit.  Please schedule with Nurse Health Advisor Thomasenia Sales, RN at [facility]

## 2020-08-24 ENCOUNTER — Encounter: Payer: Self-pay | Admitting: Family Medicine

## 2020-08-24 ENCOUNTER — Telehealth: Payer: Self-pay

## 2020-08-24 ENCOUNTER — Telehealth (INDEPENDENT_AMBULATORY_CARE_PROVIDER_SITE_OTHER): Payer: Medicare PPO | Admitting: Family Medicine

## 2020-08-24 ENCOUNTER — Other Ambulatory Visit: Payer: Self-pay

## 2020-08-24 DIAGNOSIS — U071 COVID-19: Secondary | ICD-10-CM | POA: Diagnosis not present

## 2020-08-24 DIAGNOSIS — R519 Headache, unspecified: Secondary | ICD-10-CM

## 2020-08-24 MED ORDER — MOLNUPIRAVIR EUA 200MG CAPSULE: 4.0000 | ORAL_CAPSULE | Freq: Two times a day (BID) | ORAL | 0 refills | Status: AC

## 2020-08-24 NOTE — Progress Notes (Signed)
VIRTUAL VISIT VIA VIDEO  I connected with Diana Rios on 08/24/20 at  3:30 PM EDT by elemedicine application and verified that I am speaking with the correct person using two identifiers. Location patient: Home Location provider: [facility], Office Persons participating in the virtual visit: Patient, Dr. Claiborne Billings and Reece Agar. Cesar, CMA  I discussed the limitations of evaluation and management by telemedicine and the availability of in person appointments. The patient expressed understanding and agreed to proceed.   SUBJECTIVE Chief Complaint  Patient presents with   Covid Positive    Pt c/o sore throat, HA, body aches, sweats, chills x 2 days; tested pos at home 08/23/20    HPI: Diana Rios is a 65 y.o. female present for covid illness. She tested positive to covid yesterday.  Exposure:grandchildren.  Sx start: 2 days ago Vaccine: x4 Pt endorses stuffy nose. sore throat, headaches, body aches, sweats and chills.  Pt denies shortness of breath Otc: advil cold and sinus.  GFR: >58 Anticoag: n/a  ROS: See pertinent positives and negatives per HPI.  Patient Active Problem List   Diagnosis Date Noted   Hyperlipidemia LDL goal <100 01/11/2020   Morbid obesity (HCC) 01/10/2020   Elevated glucose 01/07/2019   Abnormal mammogram 12/06/2017   Essential hypertension, benign 11/20/2014   Vitamin D deficiency 11/20/2014   Depression with anxiety 11/20/2014    Social History   Tobacco Use   Smoking status: Never   Smokeless tobacco: Never  Substance Use Topics   Alcohol use: No    Alcohol/week: 0.0 standard drinks    Current Outpatient Medications:    Blood Pressure Monitoring (BLOOD PRESSURE MONITOR/L CUFF) MISC, Monitor BP x3 weekly., Disp: 1 each, Rfl: 0   buPROPion (WELLBUTRIN XL) 300 MG 24 hr tablet, TAKE 1 TABLET EVERY DAY, Disp: 90 tablet, Rfl: 1   DULoxetine (CYMBALTA) 60 MG capsule, Take 1 capsule (60 mg total) by mouth daily., Disp: 90 capsule, Rfl: 1    lisinopril (ZESTRIL) 40 MG tablet, Take 1 tablet (40 mg total) by mouth daily., Disp: 90 tablet, Rfl: 1   Multiple Vitamin (MULTIVITAMIN) capsule, Take 1 capsule by mouth daily., Disp: , Rfl:    rosuvastatin (CRESTOR) 20 MG tablet, Take 1 tablet (20 mg total) by mouth daily., Disp: 90 tablet, Rfl: 3   Vitamin D, Ergocalciferol, (DRISDOL) 1.25 MG (50000 UNIT) CAPS capsule, Take 1 capsule (50,000 Units total) by mouth every 7 (seven) days., Disp: 12 capsule, Rfl: 0   molnupiravir EUA 200 mg CAPS, Take 4 capsules (800 mg total) by mouth 2 (two) times daily for 5 days., Disp: 40 capsule, Rfl: 0  Allergies  Allergen Reactions   Codeine Itching   Penicillins Itching    OBJECTIVE: There were no vitals taken for this visit. Gen: No acute distress. Nontoxic in appearance.  HENT: AT. [place].  MMM.  Eyes:Pupils Equal Round Reactive to light, Extraocular movements intact,  Conjunctiva without redness, discharge or icterus. Chest: Cough not present.  Skin: no rashes, purpura or petechiae.  Neuro: alert. Oriented x3  Psych: Normal affect and demeanor. Normal speech. Normal thought content and judgment.  ASSESSMENT AND PLAN: Diana Rios is a 65 y.o. female present for  COVID-19/Acute nonintractable headache, unspecified headache type Rest, hydrate.  mucinex (DM if cough) OTC Molnupiravir  prescribed, take until completed.  Hold statin.  Reviewed home care instructions for COVID. Advised self-isolation at home for at least 5 days. After 5 days, if improved and fever resolved, can be in public,  but should wear a mask around others for an additional 5 days. If symptoms, esp, dyspnea develops/worsens, recommend in-person evaluation at either an urgent care or the emergency room. F/u PRN   Felix Pacini, DO 08/24/2020   Return in about 1 year (around 08/24/2021), or if symptoms worsen or fail to improve.  No orders of the defined types were placed in this encounter.  Meds ordered this encounter   Medications   molnupiravir EUA 200 mg CAPS    Sig: Take 4 capsules (800 mg total) by mouth 2 (two) times daily for 5 days.    Dispense:  40 capsule    Refill:  0   Referral Orders  No referral(s) requested today

## 2020-08-24 NOTE — Telephone Encounter (Signed)
Pt sched for VV with PCP

## 2020-08-24 NOTE — Patient Instructions (Signed)
10 Things You Can Do to Manage Your COVID-19 Symptoms at Home If you have possible or confirmed COVID-19 Stay home except to get medical care. Monitor your symptoms carefully. If your symptoms get worse, call your healthcare provider immediately. Get rest and stay hydrated. If you have a medical appointment, call the healthcare provider ahead of time and tell them that you have or may have COVID-19. For medical emergencies, call 911 and notify the dispatch personnel that you have or may have COVID-19. Cover your cough and sneezes with a tissue or use the inside of your elbow. Wash your hands often with soap and water for at least 20 seconds or clean your hands with an alcohol-based hand sanitizer that contains at least 60% alcohol. As much as possible, stay in a specific room and away from other people in your home. Also, you should use a separate bathroom, if available. If you need to be around other people in or outside of the home, wear a mask. Avoid sharing personal items with other people in your household, like dishes, towels, and bedding. Clean all surfaces that are touched often, like counters, tabletops, and doorknobs. Use household cleaning sprays or wipes according to the label instructions. cdc.gov/coronavirus 08/16/2019 This information is not intended to replace advice given to you by your health care provider. Make sure you discuss any questions you have with your healthcare provider. Document Revised: 03/06/2020 Document Reviewed: 03/06/2020 Elsevier Patient Education  2022 Elsevier Inc.  

## 2020-08-24 NOTE — Telephone Encounter (Signed)
[facility] Night - Client TELEPHONE ADVICE RECORD [employer] Patient Name: Diana Rios Gender: Female DOB: [birth year] Age: 65 Y 5 M 11 D Return Phone Number: [phone] (Primary) Address: City/ State/ Zip: [place]  [address] Client [facility] Night - Client Client Site [facility] Night Physician Claiborne Billings, [place] Contact Type Call Who Is Calling Patient / Member / Family / Caregiver Call Type Triage / Clinical Relationship To Patient Self Return Phone Number [phone] (Primary) Chief Complaint Headache Reason for Call Symptomatic / Request for Health Information Initial Comment Caller states she has Covid and is experiencing a headache, body aches, congestion, and a fever. Needs to know if she can get a rx for Paxlovid. Translation No Nurse Assessment Nurse: Tresa Endo, RN, Kim Date/Time (Eastern Time): 08/23/2020 1:56:32 PM Confirm and document reason for call. If symptomatic, describe symptoms. ---Caller states she has had sneezing, congestion, fever and headache; sxs started yesterday and she tested positive for Covid. No fever since yesterday, took Ibuprofen today for the headache and it has gone away. Wants to know if she can get a rx for Paxlovid. Does the patient have any new or worsening symptoms? ---Yes Will a triage be completed? ---Yes Related visit to physician within the last 2 weeks? ---No Does the PT have any chronic conditions? (i.e. diabetes, asthma, this includes High risk factors for pregnancy, etc.) ---Yes List chronic conditions. ---Depression, HTN Is this a behavioral health or substance abuse call? ---No Guidelines Guideline Title Affirmed Question Affirmed Notes Nurse Date/Time (Eastern Time) COVID-19 - Diagnosed or Suspected [1] HIGH RISK for severe COVID complications (e.g., weak immune system, age > 64 years, obesity with BMI > 25, pregnant, chronic Tresa Endo, RN,  Selena Batten 08/23/2020 1:58:11 PM PLEASE NOTE: All timestamps contained within this report are represented as [place] Standard Time. CONFIDENTIALTY NOTICE: This fax transmission is intended only for the addressee. It contains information that is legally privileged, confidential or otherwise protected from use or disclosure. If you are not the intended recipient, you are strictly prohibited from reviewing, disclosing, copying using or disseminating any of this information or taking any action in reliance on or regarding this information. If you have received this fax in error, please notify us immediately by telephone so that we can arrange for its return to [place]. Phone: [phone], Toll-Free: [phone], Fax: [phone] Page: 2 of 2 Call Id: [id number] Guidelines Guideline Title Affirmed Question Affirmed Notes Nurse Date/Time Lamount Cohen Time) lung disease or other chronic medical condition) AND [2] COVID symptoms (e.g., cough, fever) (Exceptions: Already seen by PCP and no new or worsening symptoms.) Disp. Time Lamount Cohen Time) Disposition Final User 08/23/2020 1:40:43 PM Send To RN Personal Wine, Teddi 08/23/2020 2:02:29 PM See HCP within 4 Hours (or PCP triage) Yes Tresa Endo, RN, Kim Disposition Overriden: Call PCP Now Override Reason: Specify reason. (Please document in 'advice recommended' section) Caller Disagree/Comply Disagree Caller Understands Yes PreDisposition Did not know what to do Care Advice Given Per Guideline SEE HCP (OR PCP TRIAGE) WITHIN 4 HOURS: * IF OFFICE WILL BE CLOSED AND NO PCP (PRIMARY CARE PROVIDER) SECOND-LEVEL TRIAGE: You need to be seen within the next 3 or 4 hours. A nearby Urgent Care Center [facility]) is often a good source of care. Another choice is to go to the ED. Go sooner if you become worse. GENERAL CARE ADVICE FOR COVID-19 SYMPTOMS: * The symptoms are generally treated the same whether you have COVID-19, influenza or some other respiratory virus. *  Feeling  dehydrated: Drink extra liquids. If the air in your home is dry, use a humidifier. * Fever: For fever over 101 F (38.3 C), take acetaminophen every 4 to 6 hours (Adults 650 mg) OR ibuprofen every 6 to 8 hours (Adults 400 mg). Before taking any medicine, read all the instructions on the package. Do not take aspirin unless your doctor has prescribed it for you. * Muscle aches, headache, and other pains: Often this comes and goes with the fever. Take acetaminophen every 4 to 6 hours (Adults 650 mg) OR ibuprofen every 6 to 8 hours (Adults 400 mg). Before taking any medicine, read all the instructions on the package. CALL BACK IF: * You become worse CARE ADVICE given per COVID-19 - DIAGNOSED OR SUSPECTED (Adult) guideline. Referrals GO TO FACILITY REFUSED

## 2020-08-25 DIAGNOSIS — Z20822 Contact with and (suspected) exposure to covid-19: Secondary | ICD-10-CM | POA: Diagnosis not present

## 2020-08-27 ENCOUNTER — Encounter: Payer: Self-pay | Admitting: Family Medicine

## 2020-08-28 NOTE — Telephone Encounter (Signed)
Okay to draft letter stating patient has recovered from COVID within the last 90 days and is cleared to travel.  Thanks

## 2020-09-02 NOTE — Telephone Encounter (Signed)
Letter printed and at front desk

## 2020-09-02 NOTE — Telephone Encounter (Signed)
Patient would like to pick this up today. Please bring signed copy to front office.

## 2020-09-07 ENCOUNTER — Telehealth: Payer: Self-pay

## 2020-09-07 NOTE — Telephone Encounter (Signed)
Letter resent to pt. Letter printed and signed at front desk. LVM for pt to pick up letter at front desk.

## 2020-09-07 NOTE — Telephone Encounter (Signed)
Patient calling in regards to letter for cruise.  She is stating that it is incorrect.  The "date" she tested positive for COVID was 08/25/20. She also needs date changed to today's date.  Please call patient when completed [phone]

## 2020-09-23 ENCOUNTER — Ambulatory Visit: Payer: Medicare PPO

## 2020-10-21 ENCOUNTER — Ambulatory Visit: Payer: Medicare PPO

## 2020-10-22 ENCOUNTER — Ambulatory Visit: Payer: Medicare PPO | Admitting: Family Medicine

## 2020-10-27 ENCOUNTER — Ambulatory Visit: Payer: Medicare PPO | Admitting: Family Medicine

## 2020-11-19 ENCOUNTER — Encounter: Payer: Self-pay | Admitting: Family Medicine

## 2020-11-19 ENCOUNTER — Other Ambulatory Visit: Payer: Self-pay

## 2020-11-19 ENCOUNTER — Ambulatory Visit: Payer: Medicare PPO | Admitting: Family Medicine

## 2020-11-19 VITALS — BP 138/78 | HR 64 | Temp 98.7°F | Ht 66.0 in | Wt 214.0 lb

## 2020-11-19 DIAGNOSIS — I10 Essential (primary) hypertension: Secondary | ICD-10-CM | POA: Diagnosis not present

## 2020-11-19 DIAGNOSIS — F418 Other specified anxiety disorders: Secondary | ICD-10-CM | POA: Diagnosis not present

## 2020-11-19 DIAGNOSIS — L819 Disorder of pigmentation, unspecified: Secondary | ICD-10-CM | POA: Diagnosis not present

## 2020-11-19 DIAGNOSIS — Z23 Encounter for immunization: Secondary | ICD-10-CM

## 2020-11-19 DIAGNOSIS — E559 Vitamin D deficiency, unspecified: Secondary | ICD-10-CM

## 2020-11-19 DIAGNOSIS — R7309 Other abnormal glucose: Secondary | ICD-10-CM | POA: Diagnosis not present

## 2020-11-19 DIAGNOSIS — E785 Hyperlipidemia, unspecified: Secondary | ICD-10-CM | POA: Diagnosis not present

## 2020-11-19 HISTORY — DX: Disorder of pigmentation, unspecified: L81.9

## 2020-11-19 LAB — LIPID PANEL
Cholesterol: 136 mg/dL (ref 0–200)
HDL: 61.7 mg/dL (ref >39.00)
LDL Cholesterol: 52 mg/dL (ref 0–99)
NonHDL: 74.39
Total CHOL/HDL Ratio: 2
Triglycerides: 113 mg/dL (ref 0.0–149.0)
VLDL: 22.6 mg/dL (ref 0.0–40.0)

## 2020-11-19 LAB — HEMOGLOBIN A1C: Hgb A1c MFr Bld: 6.2 % (ref 4.6–6.5)

## 2020-11-19 LAB — COMPREHENSIVE METABOLIC PANEL
ALT: 18 U/L (ref 0–35)
AST: 20 U/L (ref 0–37)
Albumin: 4.1 g/dL (ref 3.5–5.2)
Alkaline Phosphatase: 97 U/L (ref 39–117)
BUN: 15 mg/dL (ref 6–23)
CO2: 28 mEq/L (ref 19–32)
Calcium: 9.3 mg/dL (ref 8.4–10.5)
Chloride: 106 mEq/L (ref 96–112)
Creatinine, Ser: 0.9 mg/dL (ref 0.40–1.20)
GFR: 66.54 mL/min (ref >60.00)
Glucose, Bld: 119 mg/dL — ABNORMAL HIGH (ref 70–99)
Potassium: 4.1 mEq/L (ref 3.5–5.1)
Sodium: 141 mEq/L (ref 135–145)
Total Bilirubin: 0.6 mg/dL (ref 0.2–1.2)
Total Protein: 6.7 g/dL (ref 6.0–8.3)

## 2020-11-19 LAB — VITAMIN D 25 HYDROXY (VIT D DEFICIENCY, FRACTURES): VITD: 24.3 ng/mL — ABNORMAL LOW (ref 30.00–100.00)

## 2020-11-19 LAB — TSH: TSH: 2.8 u[IU]/mL (ref 0.35–5.50)

## 2020-11-19 LAB — CBC
HCT: 39.3 % (ref 36.0–46.0)
Hemoglobin: 12.4 g/dL (ref 12.0–15.0)
MCHC: 31.5 g/dL (ref 30.0–36.0)
MCV: 80.5 fl (ref 78.0–100.0)
Platelets: 236 10*3/uL (ref 150.0–400.0)
RBC: 4.88 Mil/uL (ref 3.87–5.11)
RDW: 18.4 % — ABNORMAL HIGH (ref 11.5–15.5)
WBC: 4.8 10*3/uL (ref 4.0–10.5)

## 2020-11-19 MED ORDER — ROSUVASTATIN CALCIUM 20 MG PO TABS: 20.0000 mg | ORAL_TABLET | Freq: Every day | ORAL | 3 refills | Status: DC

## 2020-11-19 MED ORDER — LISINOPRIL 40 MG PO TABS: 40.0000 mg | ORAL_TABLET | Freq: Every day | ORAL | 1 refills | Status: DC

## 2020-11-19 MED ORDER — BUPROPION HCL ER (XL) 300 MG PO TB24: ORAL_TABLET | ORAL | 1 refills | Status: DC

## 2020-11-19 MED ORDER — DULOXETINE HCL 60 MG PO CPEP: 60.0000 mg | ORAL_CAPSULE | Freq: Every day | ORAL | 1 refills | Status: DC

## 2020-11-19 NOTE — Patient Instructions (Signed)
  Great to see you today.  I have refilled the medication(s) we provide.   If labs were collected, we will inform you of lab results once received either by echart message or telephone call.   - echart message- for normal results that have been seen by the patient already.   - telephone call: abnormal results or if patient has not viewed results in their echart.   Next appt in 5.5 months.  

## 2020-11-19 NOTE — Progress Notes (Signed)
Patient ID: Diana Rios, female  DOB: 1956-01-19, 65 y.o.   MRN: [id number] Patient Care Team    Relationship Specialty Notifications Start End  Natalia Leatherwood, DO PCP - General Family Medicine  11/20/14   Jeananne Rama, DO  Optometry  03/02/16    Comment: [facility]- [facility]   Chief Complaint  Patient presents with   Hypertension    CMC; pt is fasting    Subjective: Diana Rios is a 65 y.o.  Female  present for [facility] follow up Elevated glucose/diet controlled: Metformin was able to be discontinued 2020. Wt stable.  - A1c: 6.2--> 5.7 --> 5.9--> 5.7>5.9 >6.4> 5.6 (off med) - PNA series: completed today - microalbumin: on Acei  Depression with anxiety: Patient reports she is feeling well on Wellbutrin 300 mg daily and Cymbalta 60 mg daily.  Her father passed away the end of 07/07/20.   She feels she is coping well.   Pt has suffered with depression and anxiety most of her life.She feels July is always rough for her, she is uncertain why.   Hypertension/morbid obesity: Pt reports compliance  with lisinopril 40 mg QD. Patient denies chest pain, shortness of breath, dizziness or lower extremity edema.  RF: Hypertension, diabetes, obesity, family history of heart disease  Vitamin D deficiency: Patient's last vitamin D level was 20>22.  She was supplemented with high-dose vitamin D for 12 weeks.  She then started an over-the-counter vitamin D supplement.  She is uncertain the dose of her vitamin D supplement- she thinks 1000u Depression screen [facility] 2/9 11/19/2020 01/10/2020 07/11/2019 01/07/2019 03/12/2018  Decreased Interest 0 0 0 0 0  Down, Depressed, Hopeless 1 0 1 1 0  PHQ - 2 Score 1 0 1 1 0  Altered sleeping 1 - 1 0 0  Tired, decreased energy 1 - 1 0 1  Change in appetite 1 - 2 0 1  Feeling bad or failure about yourself  1 - 1 1 1   Trouble concentrating 0 - 0 0 0  Moving slowly or fidgety/restless 0 - 0 0 0  Suicidal thoughts 0 - 0 0 0  PHQ-9 Score 5 - 6 2 3    Difficult doing work/chores - - Somewhat difficult Not difficult at all -   GAD 7 : Generalized Anxiety Score 11/19/2020 01/07/2019 03/12/2018  Nervous, Anxious, on Edge 1 1 0  Control/stop worrying 1 1 0  Worry too much - different things 1 0 0  Trouble relaxing 0 0 0  Restless 0 0 0  Easily annoyed or irritable 1 0 0  Afraid - awful might happen 1 0 0  Total GAD 7 Score 5 2 0  Anxiety Difficulty - Not difficult at all Not difficult at all    Immunization History  Administered Date(s) Administered   Fluad Quad(high Dose 65+) 11/19/2020   Influenza Split 11/04/2015   Influenza, Quadrivalent, Recombinant, Inj, Pf 10/27/2018   Influenza,inj,Quad PF,6+ Mos 11/19/2016, 11/29/2017   Influenza-Unspecified 10/04/2012, 11/14/2013, 10/19/2014, 11/04/2015, 10/31/2019   PFIZER(Purple Top)SARS-COV-2 Vaccination 03/12/2019, 04/06/2019, 11/05/2019, 07/31/2020   Pneumococcal Conjugate-13 03/02/2016   Pneumococcal Polysaccharide-23 01/10/2020   Tdap 06/01/2011, 10/04/2012   Zoster, Live 11/04/2015    Past Medical History:  Diagnosis Date   Chicken pox    Depression    Diet-controlled diabetes mellitus (HCC)    Hypertension    Allergies  Allergen Reactions   Codeine Itching   Penicillins Itching   Past Surgical History:  Procedure Laterality Date  BREAST BIOPSY Left    Family History  Problem Relation Age of Onset   Heart disease Mother 72       premature   Diabetes Mother    Hypertension Father    Heart disease Sister        heart defect   Diabetes Daughter    Cancer Paternal Grandmother    Social History   Socioeconomic History   Marital status: Married    Spouse name: Not on file   Number of children: 2   Years of education: [employer]   Highest education level: Not on file  Occupational History   Occupation: Geologist, engineering    Comment: Retired  Tobacco Use   Smoking status: Never   Smokeless tobacco: Never  Building services engineer Use: Never used  Substance  and Sexual Activity   Alcohol use: No    Alcohol/week: 0.0 standard drinks   Drug use: No   Sexual activity: Yes    Birth control/protection: Post-menopausal  Other Topics Concern   Not on file  Social History Narrative   Diana Rios relocated from [place], [place]. She lived in [place], [place] for short while. 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   Social Determinants of Health   Financial Resource Strain: Not on file  Food Insecurity: Not on file  Transportation Needs: Not on file  Physical Activity: Not on file  Stress: Not on file  Social Connections: Not on file  Intimate Partner Violence: Not on file   Allergies as of 11/19/2020       Reactions   Codeine Itching   Penicillins Itching        Medication List        Accurate as of November 19, 2020 11:59 PM. If you have any questions, ask your nurse or doctor.          STOP taking these medications    Vitamin D (Ergocalciferol) 1.25 MG (50000 UNIT) Caps capsule Commonly known as: DRISDOL Stopped by: Felix Pacini, DO       TAKE these medications    Blood Pressure Monitor/L Cuff Misc Monitor BP x3 weekly.   buPROPion 300 MG 24 hr tablet Commonly known as: WELLBUTRIN XL TAKE 1 TABLET EVERY DAY   cholecalciferol 25 MCG (1000 UNIT) tablet Commonly known as: VITAMIN D3 Take 1,000 Units by mouth daily.   DULoxetine 60 MG capsule Commonly known as: CYMBALTA Take 1 capsule (60 mg total) by mouth daily.   lisinopril 40 MG tablet Commonly known as: ZESTRIL Take 1 tablet (40 mg total) by mouth daily.   multivitamin capsule Take 1 capsule by mouth daily.   rosuvastatin 20 MG tablet Commonly known as: CRESTOR Take 1 tablet (20 mg total) by mouth daily.         Recent Results (from the past 2160 hour(s))  CBC     Status: Abnormal   Collection Time: 11/19/20  8:58 AM  Result Value Ref Range   WBC 4.8 4.0 - 10.5 K/uL   RBC 4.88 3.87 - 5.11 Mil/uL   Platelets 236.0 150.0 - 400.0  K/uL   Hemoglobin 12.4 12.0 - 15.0 g/dL   HCT 59.5 63.8 - 75.6 %   MCV 80.5 78.0 - 100.0 fl   MCHC 31.5 30.0 - 36.0 g/dL   RDW 43.3 (H) 29.5 - 18.8 %  Comprehensive metabolic panel     Status: Abnormal   Collection Time: 11/19/20  8:58 AM  Result Value Ref  Range   Sodium 141 135 - 145 mEq/L   Potassium 4.1 3.5 - 5.1 mEq/L   Chloride 106 96 - 112 mEq/L   CO2 28 19 - 32 mEq/L   Glucose, Bld 119 (H) 70 - 99 mg/dL   BUN 15 6 - 23 mg/dL   Creatinine, Ser 4.48 0.40 - 1.20 mg/dL   Total Bilirubin 0.6 0.2 - 1.2 mg/dL   Alkaline Phosphatase 97 39 - 117 U/L   AST 20 0 - 37 U/L   ALT 18 0 - 35 U/L   Total Protein 6.7 6.0 - 8.3 g/dL   Albumin 4.1 3.5 - 5.2 g/dL   GFR 18.56 >31.49 mL/min    Comment: Calculated using the CKD-EPI Creatinine Equation (2021)   Calcium 9.3 8.4 - 10.5 mg/dL  Hemoglobin F0Y     Status: None   Collection Time: 11/19/20  8:58 AM  Result Value Ref Range   Hgb A1c MFr Bld 6.2 4.6 - 6.5 %    Comment: Glycemic Control Guidelines for People with Diabetes:Non Diabetic:  <6%Goal of Therapy: <7%Additional Action Suggested:  >8%   Lipid panel     Status: None   Collection Time: 11/19/20  8:58 AM  Result Value Ref Range   Cholesterol 136 0 - 200 mg/dL    Comment: ATP III Classification       Desirable:  < 200 mg/dL               Borderline High:  200 - 239 mg/dL          High:  > = 637 mg/dL   Triglycerides 858.8 0.0 - 149.0 mg/dL    Comment: Normal:  <502 mg/dLBorderline High:  150 - 199 mg/dL   HDL 77.41 >28.78 mg/dL   VLDL 67.6 0.0 - 72.0 mg/dL   LDL Cholesterol 52 0 - 99 mg/dL   Total CHOL/HDL Ratio 2     Comment:                Men          Women1/2 Average Risk     3.4          3.3Average Risk          5.0          4.42X Average Risk          9.6          7.13X Average Risk          15.0          11.0                       NonHDL 74.39     Comment: NOTE:  Non-HDL goal should be 30 mg/dL higher than patient's LDL goal (i.e. LDL goal of < 70 mg/dL, would have non-HDL  goal of < 100 mg/dL)  TSH     Status: None   Collection Time: 11/19/20  8:58 AM  Result Value Ref Range   TSH 2.80 0.35 - 5.50 uIU/mL  VITAMIN D 25 Hydroxy (Vit-D Deficiency, Fractures)     Status: Abnormal   Collection Time: 11/19/20  8:58 AM  Result Value Ref Range   VITD 24.30 (L) 30.00 - 100.00 ng/mL      ROS: 14 pt review of systems performed and negative (unless mentioned in an HPI)  Objective: BP 138/78   Pulse 64   Temp 98.7 F (37.1 C) (Oral)   Ht  5\' 6"  (1.676 m)   Wt 214 lb (97.1 kg)   SpO2 96%   BMI 34.54 kg/m   Gen: Afebrile. No acute distress.  HENT: AT. [place].no cough.  Eyes:Pupils Equal Round Reactive to light, Extraocular movements intact,  Conjunctiva without redness, discharge or icterus. CV: RRR no murmur, no edema, +2/4 P posterior tibialis pulses Chest: CTAB, no wheeze or crackles Skin: no rashes, purpura or petechiae. Pinpoint hyperpigmented lesion forehead above right medial eyebrow.  Neuro: Normal gait. PERLA. EOMi. Alert. Oriented x3 Psych: Normal affect, dress and demeanor. Normal speech. Normal thought content and judgment.   No results found for this or any previous visit (from the past 24 hour(s)).   Assessment/plan: Zalayah Pizzuto is a 65 y.o. female present for cmc Elevated glucose/obesity - A1c 5.9--> 5.7 --> 5.6 >>5.9 >6.4> >5.6> A1c collected Discussed that she is diet controlled and as long as A1c stays below 6.3 she should not need medications.  -Remove diabetes from her present list> elevated glucose.  She has been well controlled without medications.   - Continue diet and exercise regimen - PNA series: completed  Depression, unspecified depression type -stable -continue  Cymbalta  -continue Wellbutrin.    Essential hypertension, benign/obesity - stable. .  - continue lisinopril 40 mg daily.  - low sodium diet - continue exercise.  Cbc, cmp, tsh, lipid collected today-fasting  Vitamin D deficiency: Vit d collected  tday Continue 2000 u daily supplement.   Hyperpigmented lesion: ? Concerning for melanoma   Need for influenza vaccination - Flu Vaccine QUAD High Dose(Fluad)    Follow-up in 5.5 months on chronic medical conditions, sooner if needed  Orders Placed This Encounter  Procedures   Flu Vaccine QUAD High Dose(Fluad)   CBC   Comprehensive metabolic panel   Hemoglobin A1c   Lipid panel   TSH   VITAMIN D 25 Hydroxy (Vit-D Deficiency, Fractures)   Ambulatory referral to Dermatology   Meds ordered this encounter  Medications   buPROPion (WELLBUTRIN XL) 300 MG 24 hr tablet    Sig: TAKE 1 TABLET EVERY DAY    Dispense:  90 tablet    Refill:  1   DULoxetine (CYMBALTA) 60 MG capsule    Sig: Take 1 capsule (60 mg total) by mouth daily.    Dispense:  90 capsule    Refill:  1   lisinopril (ZESTRIL) 40 MG tablet    Sig: Take 1 tablet (40 mg total) by mouth daily.    Dispense:  90 tablet    Refill:  1   rosuvastatin (CRESTOR) 20 MG tablet    Sig: Take 1 tablet (20 mg total) by mouth daily.    Dispense:  90 tablet    Refill:  3    Referral Orders         Ambulatory referral to Dermatology          Electronically signed by: 2001, DO [facility] [place]

## 2020-12-16 ENCOUNTER — Other Ambulatory Visit: Payer: Self-pay | Admitting: Family Medicine

## 2020-12-16 DIAGNOSIS — Z1231 Encounter for screening mammogram for malignant neoplasm of breast: Secondary | ICD-10-CM

## 2020-12-30 ENCOUNTER — Ambulatory Visit: Payer: Medicare PPO | Admitting: Dermatology

## 2021-01-04 DIAGNOSIS — L814 Other melanin hyperpigmentation: Secondary | ICD-10-CM | POA: Diagnosis not present

## 2021-01-04 DIAGNOSIS — L570 Actinic keratosis: Secondary | ICD-10-CM | POA: Diagnosis not present

## 2021-01-04 DIAGNOSIS — L821 Other seborrheic keratosis: Secondary | ICD-10-CM | POA: Diagnosis not present

## 2021-01-06 LAB — FECAL OCCULT BLOOD, IMMUNOCHEMICAL: IFOBT: NEGATIVE

## 2021-01-08 ENCOUNTER — Telehealth: Payer: Self-pay

## 2021-01-08 NOTE — Telephone Encounter (Signed)
LVM for pt to call back. Needs to schedule AWV with health coach or on a Thursday afternoon with Erie Noe

## 2021-01-19 ENCOUNTER — Ambulatory Visit
Admission: RE | Admit: 2021-01-19 | Discharge: 2021-01-19 | Disposition: A | Discharge status: Home / Self Care | Payer: Medicare PPO | Source: Ambulatory Visit | Attending: Family Medicine | Admitting: Family Medicine

## 2021-01-19 DIAGNOSIS — Z1231 Encounter for screening mammogram for malignant neoplasm of breast: Secondary | ICD-10-CM | POA: Diagnosis not present

## 2021-04-05 DIAGNOSIS — L578 Other skin changes due to chronic exposure to nonionizing radiation: Secondary | ICD-10-CM | POA: Diagnosis not present

## 2021-04-05 DIAGNOSIS — L570 Actinic keratosis: Secondary | ICD-10-CM | POA: Diagnosis not present

## 2021-04-05 DIAGNOSIS — L820 Inflamed seborrheic keratosis: Secondary | ICD-10-CM | POA: Diagnosis not present

## 2021-04-05 DIAGNOSIS — L538 Other specified erythematous conditions: Secondary | ICD-10-CM | POA: Diagnosis not present

## 2021-04-23 ENCOUNTER — Telehealth: Payer: Self-pay

## 2021-04-23 NOTE — Telephone Encounter (Signed)
Called pt to schedule AWV. Please schedule with health coach, Toria or Jennessy Sandridge. ? ?

## 2021-05-04 ENCOUNTER — Encounter: Payer: Self-pay | Admitting: Family Medicine

## 2021-05-04 ENCOUNTER — Ambulatory Visit: Payer: Medicare PPO | Admitting: Family Medicine

## 2021-05-04 VITALS — BP 124/72 | HR 68 | Temp 98.1°F | Ht 66.0 in | Wt 210.0 lb

## 2021-05-04 DIAGNOSIS — E785 Hyperlipidemia, unspecified: Secondary | ICD-10-CM | POA: Diagnosis not present

## 2021-05-04 DIAGNOSIS — R7309 Other abnormal glucose: Secondary | ICD-10-CM | POA: Diagnosis not present

## 2021-05-04 DIAGNOSIS — F418 Other specified anxiety disorders: Secondary | ICD-10-CM

## 2021-05-04 DIAGNOSIS — I10 Essential (primary) hypertension: Secondary | ICD-10-CM | POA: Diagnosis not present

## 2021-05-04 LAB — POCT GLYCOSYLATED HEMOGLOBIN (HGB A1C)
HbA1c POC (<> result, manual entry): 6 % (ref 4.0–5.6)
HbA1c, POC (controlled diabetic range): 6 % (ref 0.0–7.0)
HbA1c, POC (prediabetic range): 6 % (ref 5.7–6.4)
Hemoglobin A1C: 6 % — AB (ref 4.0–5.6)

## 2021-05-04 MED ORDER — LISINOPRIL 40 MG PO TABS: 40.0000 mg | ORAL_TABLET | Freq: Every day | ORAL | 1 refills | Status: DC

## 2021-05-04 MED ORDER — DULOXETINE HCL 60 MG PO CPEP: 60.0000 mg | ORAL_CAPSULE | Freq: Every day | ORAL | 1 refills | Status: DC

## 2021-05-04 MED ORDER — BUPROPION HCL ER (XL) 300 MG PO TB24: ORAL_TABLET | ORAL | 1 refills | Status: DC

## 2021-05-04 MED ORDER — ROSUVASTATIN CALCIUM 20 MG PO TABS: 20.0000 mg | ORAL_TABLET | Freq: Every day | ORAL | 3 refills | Status: DC

## 2021-05-04 NOTE — Patient Instructions (Addendum)
? ? ?  [place]- ?Body mass index is 33.89 kg/m?Marland Kitchen ?Serious comorbidity d/t obesity (hypertension/hyperlipidemia) ?Elevated a1c  ?

## 2021-05-04 NOTE — Progress Notes (Signed)
? ? ? ? ?Patient ID: Diana Rios, female  DOB: 01/14/1956, 66 y.o.   MRN: [id number] ?Patient Care Team  ?  Relationship Specialty Notifications Start End  ?Natalia Leatherwood, DO PCP - General Family Medicine  11/20/14   ?Jeananne Rama, DO  Optometry  03/02/16   ? Comment: [facility] [place]  ? ?Chief Complaint  ?Patient presents with  ? Hypertension  ?  CMC; pt is fasting  ? ? ?Subjective: ?Diana Rios is a 66 y.o.  Female  present for [facility] follow up ?Elevated glucose/diet controlled: ?Metformin was able to be discontinued 2020. Wt stable.  ?- A1c: 6.2--> 5.7 --> 5.9--> 5.7>5.9 >6.4> 5.6 > 6.3>6.0 (off med) ?- PNA series: completed today ?- microalbumin: on Acei ? ?Depression with anxiety: Patient reports she is feeling well on Wellbutrin 300 mg daily and Cymbalta 60 mg daily.  Pt has suffered with depression and anxiety most of her life.She feels July is always rough for her, she is uncertain why. Her father passed away last year in MAY.  ? ?Hypertension/morbid obesity: Pt reports compliance with lisinopril 40 mg QD. Patient denies chest pain, shortness of breath, dizziness or lower extremity edema.  ?RF: Hypertension, diabetes, obesity, family history of heart disease ? ?Vitamin D deficiency: ?Patient's last vitamin D level was 20>22.  She is supplementing daily. thinks 1000u ? ?  11/19/2020  ?  8:31 AM 01/10/2020  ?  9:50 AM 07/11/2019  ? 10:30 AM 01/07/2019  ?  1:30 PM 03/12/2018  ?  1:29 PM  ?Depression screen PHQ 2/9  ?Decreased Interest 0 0 0 0 0  ?Down, Depressed, Hopeless 1 0 1 1 0  ?PHQ - 2 Score 1 0 1 1 0  ?Altered sleeping 1  1 0 0  ?Tired, decreased energy 1  1 0 1  ?Change in appetite 1  2 0 1  ?Feeling bad or failure about yourself  1  1 1 1   ?Trouble concentrating 0  0 0 0  ?Moving slowly or fidgety/restless 0  0 0 0  ?Suicidal thoughts 0  0 0 0  ?PHQ-9 Score 5  6 2 3   ?Difficult doing work/chores   Somewhat difficult Not difficult at all   ? ? ?  11/19/2020  ?  8:46 AM 01/07/2019  ?  1:32  PM 03/12/2018  ?  1:32 PM  ?GAD 7 : Generalized Anxiety Score  ?Nervous, Anxious, on Edge 1 1 0  ?Control/stop worrying 1 1 0  ?Worry too much - different things 1 0 0  ?Trouble relaxing 0 0 0  ?Restless 0 0 0  ?Easily annoyed or irritable 1 0 0  ?Afraid - awful might happen 1 0 0  ?Total GAD 7 Score 5 2 0  ?Anxiety Difficulty  Not difficult at all Not difficult at all  ? ? ?Immunization History  ?Administered Date(s) Administered  ? Fluad Quad(high Dose 65+) 11/19/2020  ? Influenza Split 11/04/2015  ? Influenza, Quadrivalent, Recombinant, Inj, Pf 10/27/2018  ? Influenza,inj,Quad PF,6+ Mos 11/19/2016, 11/29/2017  ? Influenza-Unspecified 10/04/2012, 11/14/2013, 10/19/2014, 11/04/2015, 10/31/2019  ? PFIZER(Purple Top)SARS-COV-2 Vaccination 03/12/2019, 04/06/2019, 11/05/2019, 07/31/2020  ? Pneumococcal Conjugate-13 03/02/2016  ? Pneumococcal Polysaccharide-23 01/10/2020  ? Tdap 06/01/2011, 10/04/2012  ? Zoster, Live 11/04/2015  ? ? ?Past Medical History:  ?Diagnosis Date  ? Chicken pox   ? Depression   ? Diet-controlled diabetes mellitus (HCC)   ? Hypertension   ? ?Allergies  ?Allergen Reactions  ? Codeine Itching  ? Penicillins  Itching  ? ?Past Surgical History:  ?Procedure Laterality Date  ? BREAST BIOPSY Left   ? ?Family History  ?Problem Relation Age of Onset  ? Heart disease Mother 12  ?     premature  ? Diabetes Mother   ? Hypertension Father   ? Heart disease Sister   ?     heart defect  ? Diabetes Daughter   ? Cancer Paternal Grandmother   ? ?Social History  ? ?Socioeconomic History  ? Marital status: Married  ?  Spouse name: Not on file  ? Number of children: 2  ? Years of education: [employer]  ? Highest education level: Not on file  ?Occupational History  ? Occupation: Geologist, engineering  ?  Comment: Retired  ?Tobacco Use  ? Smoking status: Never  ? Smokeless tobacco: Never  ?Vaping Use  ? Vaping Use: Never used  ?Substance and Sexual Activity  ? Alcohol use: No  ?  Alcohol/week: 0.0 standard drinks  ? Drug  use: No  ? Sexual activity: Yes  ?  Birth control/protection: Post-menopausal  ?Other Topics Concern  ? Not on file  ?Social History Narrative  ? Ms Lehrman relocated from [place], [place]. She lived in [place], [place] for short while. 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  ? ?Social Determinants of Health  ? ?Financial Resource Strain: Not on file  ?Food Insecurity: Not on file  ?Transportation Needs: Not on file  ?Physical Activity: Not on file  ?Stress: Not on file  ?Social Connections: Not on file  ?Intimate Partner Violence: Not on file  ? ?Allergies as of 05/04/2021   ? ?   Reactions  ? Codeine Itching  ? Penicillins Itching  ? ?  ? ?  ?Medication List  ?  ? ?  ? Accurate as of May 04, 2021  9:53 AM. If you have any questions, ask your nurse or doctor.  ?  ?  ? ?  ? ?STOP taking these medications   ? ?Blood Pressure Monitor/L Cuff Misc ?Stopped by: Felix Pacini, DO ?  ? ?  ? ?TAKE these medications   ? ?buPROPion 300 MG 24 hr tablet ?Commonly known as: WELLBUTRIN XL ?TAKE 1 TABLET EVERY DAY ?  ?cholecalciferol 25 MCG (1000 UNIT) tablet ?Commonly known as: VITAMIN D3 ?Take 1,000 Units by mouth daily. ?  ?DULoxetine 60 MG capsule ?Commonly known as: CYMBALTA ?Take 1 capsule (60 mg total) by mouth daily. ?  ?lisinopril 40 MG tablet ?Commonly known as: ZESTRIL ?Take 1 tablet (40 mg total) by mouth daily. ?  ?multivitamin capsule ?Take 1 capsule by mouth daily. ?  ?rosuvastatin 20 MG tablet ?Commonly known as: CRESTOR ?Take 1 tablet (20 mg total) by mouth daily. ?  ? ?  ? ? ? ?Recent Results (from the past 2160 hour(s))  ?POCT HgB A1C     Status: Abnormal  ? Collection Time: 05/04/21  9:11 AM  ?Result Value Ref Range  ? Hemoglobin A1C 6.0 (A) 4.0 - 5.6 %  ? HbA1c POC (<> result, manual entry) 6.0 4.0 - 5.6 %  ? HbA1c, POC (prediabetic range) 6.0 5.7 - 6.4 %  ? HbA1c, POC (controlled diabetic range) 6.0 0.0 - 7.0 %  ? ? ? ? ? ?ROS: 14 pt review of systems performed and negative (unless mentioned in an  HPI) ? ?Objective: ?BP 124/72   Pulse 68   Temp 98.1 ?F (36.7 ?C) (Oral)   Ht 5\' 6"  (1.676 m)  Wt 210 lb (95.3 kg)   SpO2 95%   BMI 33.89 kg/m?   ?Physical Exam ?Vitals and nursing note reviewed.  ?Constitutional:   ?   General: She is not in acute distress. ?   Appearance: Normal appearance. She is not ill-appearing, toxic-appearing or diaphoretic.  ?HENT:  ?   Head: Normocephalic and atraumatic.  ?Eyes:  ?   General: No scleral icterus.    ?   Right eye: No discharge.     ?   Left eye: No discharge.  ?   Extraocular Movements: Extraocular movements intact.  ?   Conjunctiva/sclera: Conjunctivae normal.  ?   Pupils: Pupils are equal, round, and reactive to light.  ?Cardiovascular:  ?   Rate and Rhythm: Normal rate and regular rhythm.  ?   Heart sounds: No murmur heard. ?Pulmonary:  ?   Effort: Pulmonary effort is normal. No respiratory distress.  ?   Breath sounds: Normal breath sounds. No wheezing, rhonchi or rales.  ?Musculoskeletal:  ?   Cervical back: Neck supple. No tenderness.  ?   Right lower leg: No edema.  ?   Left lower leg: No edema.  ?Lymphadenopathy:  ?   Cervical: No cervical adenopathy.  ?Skin: ?   General: Skin is warm and dry.  ?   Coloration: Skin is not jaundiced or pale.  ?   Findings: No erythema or rash.  ?Neurological:  ?   Mental Status: She is alert and oriented to person, place, and time. Mental status is at baseline.  ?   Motor: No weakness.  ?   Gait: Gait normal.  ?Psychiatric:     ?   Mood and Affect: Mood normal.     ?   Behavior: Behavior normal.     ?   Thought Content: Thought content normal.     ?   Judgment: Judgment normal.  ? ? ? ?Results for orders placed or performed in visit on 05/04/21 (from the past 24 hour(s))  ?POCT HgB A1C     Status: Abnormal  ? Collection Time: 05/04/21  9:11 AM  ?Result Value Ref Range  ? Hemoglobin A1C 6.0 (A) 4.0 - 5.6 %  ? HbA1c POC (<> result, manual entry) 6.0 4.0 - 5.6 %  ? HbA1c, POC (prediabetic range) 6.0 5.7 - 6.4 %  ? HbA1c, POC  (controlled diabetic range) 6.0 0.0 - 7.0 %  ? ? ?Assessment/plan: ?Diana Rios is a 66 y.o. female present for cmc ?Elevated glucose/obesity  ?- A1c 5.9--> 5.7 --> 5.6 >>5.9 >6.4> >5.6> 6.3> 6.0  A1c col

## 2021-05-05 NOTE — Telephone Encounter (Signed)
Please see message below

## 2021-05-06 MED ORDER — WEGOVY 0.25 MG/0.5ML ~~LOC~~ SOAJ: 0.2500 mg | SUBCUTANEOUS | 1 refills | Status: DC

## 2021-05-06 NOTE — Telephone Encounter (Signed)
Called in the [facility] starter dose of 0.25 mg weekly injection.  If patient would like to have instruction on proper injection technique, she can make a nurse visit for tutorial. ?Follow-up in 3-1/2-4 weeks, before requiring fifth injections so that we can decide if it is appropriate to increase the taper or remain on initial dose. ?

## 2021-05-12 ENCOUNTER — Ambulatory Visit (INDEPENDENT_AMBULATORY_CARE_PROVIDER_SITE_OTHER): Payer: Medicare PPO

## 2021-05-12 DIAGNOSIS — Z0000 Encounter for general adult medical examination without abnormal findings: Secondary | ICD-10-CM | POA: Diagnosis not present

## 2021-05-12 NOTE — Patient Instructions (Signed)
Ms. Holeman , ?Thank you for taking time to come for your Medicare Wellness Visit. I appreciate your ongoing commitment to your health goals. Please review the following plan we discussed and let me know if I can assist you in the future.  ? ?Screening recommendations/referrals: ?Colonoscopy: cologuard 127/22 repeat every 3 years  ?Mammogram: Done 01/19/21 repeat every year  ?Bone Density: Done 10/17/16 repeat every 2 years  ?Recommended yearly ophthalmology/optometry visit for glaucoma screening and checkup ?Recommended yearly dental visit for hygiene and checkup ? ?Vaccinations: ?Influenza vaccine: Done 11/19/20 repeat every year  ?Pneumococcal vaccine: Up to date ?Tdap vaccine: Done 10/04/12 repeat every 10 years  ?Shingles vaccine: Shingrix discussed. Please contact your pharmacy for coverage information.    ?Covid-19:Completed 2/9, 3/6, 11/05/19 & 07/31/20 ? ?Advanced directives: Advance directive discussed with you today. I have provided a copy for you to complete at home and have notarized. Once this is complete please bring a copy in to our office so we can scan it into your chart. ? ?Conditions/risks identified: Lose weight  ? ?Next appointment: Follow up in one year for your annual wellness visit  ? ? ?Preventive Care 65 Years and Older, Female ?Preventive care refers to lifestyle choices and visits with your health care provider that can promote health and wellness. ?What does preventive care include? ?A yearly physical exam. This is also called an annual well check. ?Dental exams once or twice a year. ?Routine eye exams. Ask your health care provider how often you should have your eyes checked. ?Personal lifestyle choices, including: ?Daily care of your teeth and gums. ?Regular physical activity. ?Eating a healthy diet. ?Avoiding tobacco and drug use. ?Limiting alcohol use. ?Practicing safe sex. ?Taking low-dose aspirin every day. ?Taking vitamin and mineral supplements as recommended by your health care  provider. ?What happens during an annual well check? ?The services and screenings done by your health care provider during your annual well check will depend on your age, overall health, lifestyle risk factors, and family history of disease. ?Counseling  ?Your health care provider may ask you questions about your: ?Alcohol use. ?Tobacco use. ?Drug use. ?Emotional well-being. ?Home and relationship well-being. ?Sexual activity. ?Eating habits. ?History of falls. ?Memory and ability to understand (cognition). ?Work and work Statistician. ?Reproductive health. ?Screening  ?You may have the following tests or measurements: ?Height, weight, and BMI. ?Blood pressure. ?Lipid and cholesterol levels. These may be checked every 5 years, or more frequently if you are over 45 years old. ?Skin check. ?Lung cancer screening. You may have this screening every year starting at age 68 if you have a 30-pack-year history of smoking and currently smoke or have quit within the past 15 years. ?Fecal occult blood test (FOBT) of the stool. You may have this test every year starting at age 9. ?Flexible sigmoidoscopy or colonoscopy. You may have a sigmoidoscopy every 5 years or a colonoscopy every 10 years starting at age 26. ?Hepatitis C blood test. ?Hepatitis B blood test. ?Sexually transmitted disease (STD) testing. ?Diabetes screening. This is done by checking your blood sugar (glucose) after you have not eaten for a while (fasting). You may have this done every 1-3 years. ?Bone density scan. This is done to screen for osteoporosis. You may have this done starting at age 50. ?Mammogram. This may be done every 1-2 years. Talk to your health care provider about how often you should have regular mammograms. ?Talk with your health care provider about your test results, treatment options, and if necessary,  the need for more tests. ?Vaccines  ?Your health care provider may recommend certain vaccines, such as: ?Influenza vaccine. This is  recommended every year. ?Tetanus, diphtheria, and acellular pertussis (Tdap, Td) vaccine. You may need a Td booster every 10 years. ?Zoster vaccine. You may need this after age 52. ?Pneumococcal 13-valent conjugate (PCV13) vaccine. One dose is recommended after age 98. ?Pneumococcal polysaccharide (PPSV23) vaccine. One dose is recommended after age 16. ?Talk to your health care provider about which screenings and vaccines you need and how often you need them. ?This information is not intended to replace advice given to you by your health care provider. Make sure you discuss any questions you have with your health care provider. ?Document Released: 02/13/2015 Document Revised: 10/07/2015 Document Reviewed: 11/18/2014 ?Elsevier Interactive Patient Education ? 2017 [employer]. ? ?Fall Prevention in the Home ?Falls can cause injuries. They can happen to people of all ages. There are many things you can do to make your home safe and to help prevent falls. ?What can I do on the outside of my home? ?Regularly fix the edges of walkways and driveways and fix any cracks. ?Remove anything that might make you trip as you walk through a door, such as a raised step or threshold. ?Trim any bushes or trees on the path to your home. ?Use bright outdoor lighting. ?Clear any walking paths of anything that might make someone trip, such as rocks or tools. ?Regularly check to see if handrails are loose or broken. Make sure that both sides of any steps have handrails. ?Any raised decks and porches should have guardrails on the edges. ?Have any leaves, snow, or ice cleared regularly. ?Use sand or salt on walking paths during winter. ?Clean up any spills in your garage right away. This includes oil or grease spills. ?What can I do in the bathroom? ?Use night lights. ?Install grab bars by the toilet and in the tub and shower. Do not use towel bars as grab bars. ?Use non-skid mats or decals in the tub or shower. ?If you need to sit down in  the shower, use a plastic, non-slip stool. ?Keep the floor dry. Clean up any water that spills on the floor as soon as it happens. ?Remove soap buildup in the tub or shower regularly. ?Attach bath mats securely with double-sided non-slip rug tape. ?Do not have throw rugs and other things on the floor that can make you trip. ?What can I do in the bedroom? ?Use night lights. ?Make sure that you have a light by your bed that is easy to reach. ?Do not use any sheets or blankets that are too big for your bed. They should not hang down onto the floor. ?Have a firm chair that has side arms. You can use this for support while you get dressed. ?Do not have throw rugs and other things on the floor that can make you trip. ?What can I do in the kitchen? ?Clean up any spills right away. ?Avoid walking on wet floors. ?Keep items that you use a lot in easy-to-reach places. ?If you need to reach something above you, use a strong step stool that has a grab bar. ?Keep electrical cords out of the way. ?Do not use floor polish or wax that makes floors slippery. If you must use wax, use non-skid floor wax. ?Do not have throw rugs and other things on the floor that can make you trip. ?What can I do with my stairs? ?Do not leave any items on  the stairs. ?Make sure that there are handrails on both sides of the stairs and use them. Fix handrails that are broken or loose. Make sure that handrails are as long as the stairways. ?Check any carpeting to make sure that it is firmly attached to the stairs. Fix any carpet that is loose or worn. ?Avoid having throw rugs at the top or bottom of the stairs. If you do have throw rugs, attach them to the floor with carpet tape. ?Make sure that you have a light switch at the top of the stairs and the bottom of the stairs. If you do not have them, ask someone to add them for you. ?What else can I do to help prevent falls? ?Wear shoes that: ?Do not have high heels. ?Have rubber bottoms. ?Are comfortable  and fit you well. ?Are closed at the toe. Do not wear sandals. ?If you use a stepladder: ?Make sure that it is fully opened. Do not climb a closed stepladder. ?Make sure that both sides of the stepladder are lock

## 2021-05-12 NOTE — Progress Notes (Signed)
Virtual Visit via Telephone Note ? ?I connected with  Diana Rios on 05/12/21 at 11:00 AM EDT by telephone and verified that I am speaking with the correct person using two identifiers. ? ?Medicare Annual Wellness visit completed telephonically due to Covid-19 pandemic.  ? ?Persons participating in this call: This Health Coach and this patient.  ? ?Location: ?Patient: Home ?Provider: Office  ?  ?I discussed the limitations, risks, security and privacy concerns of performing an evaluation and management service by telephone and the availability of in person appointments. The patient expressed understanding and agreed to proceed. ? ?Unable to perform video visit due to video visit attempted and failed and/or patient does not have video capability.  ? ?Some vital signs may be absent or patient reported.  ? ?[name], LPN ? ? ?Subjective:  ? Diana Rios is a 66 y.o. female who presents for an Initial Medicare Annual Wellness Visit. ? ?Review of Systems    ? ?Cardiac Risk Factors include: advanced age (>51men, >51 women);hypertension;dyslipidemia;obesity (BMI >30kg/m2) ? ?   ?Objective:  ?  ?There were no vitals filed for this visit. ?There is no height or weight on file to calculate BMI. ? ? ?  05/12/2021  ? 10:58 AM  ?Advanced Directives  ?Does Patient Have a Medical Advance Directive? No  ?Would patient like information on creating a medical advance directive? Yes (MAU/Ambulatory/Procedural Areas - Information given)  ? ? ?Current Medications (verified) ?Outpatient Encounter Medications as of 05/12/2021  ?Medication Sig  ? buPROPion (WELLBUTRIN XL) 300 MG 24 hr tablet TAKE 1 TABLET EVERY DAY  ? cholecalciferol (VITAMIN D3) 25 MCG (1000 UNIT) tablet Take 1,000 Units by mouth daily.  ? DULoxetine (CYMBALTA) 60 MG capsule Take 1 capsule (60 mg total) by mouth daily.  ? lisinopril (ZESTRIL) 40 MG tablet Take 1 tablet (40 mg total) by mouth daily.  ? Multiple Vitamin (MULTIVITAMIN) capsule Take 1 capsule  by mouth daily.  ? rosuvastatin (CRESTOR) 20 MG tablet Take 1 tablet (20 mg total) by mouth daily.  ? WEGOVY 0.25 MG/0.5ML SOAJ Inject 0.25 mg into the skin once a week.  ? ?No facility-administered encounter medications on file as of 05/12/2021.  ? ? ?Allergies (verified) ?Codeine and Penicillins  ? ?History: ?Past Medical History:  ?Diagnosis Date  ? Chicken pox   ? Depression   ? Diet-controlled diabetes mellitus (HCC)   ? Hypertension   ? ?Past Surgical History:  ?Procedure Laterality Date  ? BREAST BIOPSY Left   ? ?Family History  ?Problem Relation Age of Onset  ? Heart disease Mother 64  ?     premature  ? Diabetes Mother   ? Hypertension Father   ? Heart disease Sister   ?     heart defect  ? Diabetes Daughter   ? Cancer Paternal Grandmother   ? ?Social History  ? ?Socioeconomic History  ? Marital status: Married  ?  Spouse name: Not on file  ? Number of children: 2  ? Years of education: [employer]  ? Highest education level: Not on file  ?Occupational History  ? Occupation: Geologist, engineering  ?  Comment: Retired  ?Tobacco Use  ? Smoking status: Never  ? Smokeless tobacco: Never  ?Vaping Use  ? Vaping Use: Never used  ?Substance and Sexual Activity  ? Alcohol use: No  ?  Alcohol/week: 0.0 standard drinks  ? Drug use: No  ? Sexual activity: Yes  ?  Birth control/protection: Post-menopausal  ?Other Topics  Concern  ? Not on file  ?Social History Narrative  ? Ms Diana R[place]. She lived in [place], [place] for short while. 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  ? ?Social Determinants of Health  ? ?Financial Resource Strain: Low Risk   ? Difficulty of Paying Living Expenses: Not hard at all  ?Food Insecurity: No Food Insecurity  ? Worried About Programme researcher, broadcasting/film/videounning Out of Food in the Last Year: Never true  ? Ran Out of Food in the Last Year: Never true  ?Transportation Needs: No Transportation Needs  ? Lack of Transportation (Medical): No  ? Lack of Transportation (Non-Medical): No   ?Physical Activity: Inactive  ? Days of Exercise per Week: 0 days  ? Minutes of Exercise per Session: 0 min  ?Stress: No Stress Concern Present  ? Feeling of Stress : Not at all  ?Social Connections: Socially Integrated  ? Frequency of Communication with Friends and Family: More than three times a week  ? Frequency of Social Gatherings with Friends and Family: More than three times a week  ? Attends Religious Services: More than 4 times per year  ? Active Member of Clubs or Organizations: Yes  ? Attends BankerClub or Organization Meetings: 1 to 4 times per year  ? Marital Status: Married  ? ? ?Tobacco Counseling ?Counseling given: Not Answered ? ? ?Clinical Intake: ? ?Pre-visit preparation completed: Yes ? ?Pain : No/denies pain ? ?  ? ?BMI - recorded: 33.91 ?Nutritional Status: BMI > 30  Obese ?Nutritional Risks: None ?Diabetes: No ? ?How often do you need to have someone help you when you read instructions, pamphlets, or other written materials from your doctor or pharmacy?: 1 - Never ? ?Diabetic?no ? ?Interpreter Needed?: No ? ?Information entered by :: Lanier Ensignina Grecia Lynk, LPN ? ? ?Activities of Daily Living ? ?  05/12/2021  ? 10:59 AM  ?In your present state of health, do you have any difficulty performing the following activities:  ?Hearing? 0  ?Vision? 0  ?Difficulty concentrating or making decisions? 0  ?Walking or climbing stairs? 0  ?Dressing or bathing? 0  ?Doing errands, shopping? 0  ?Preparing Food and eating ? N  ?Using the Toilet? N  ?In the past six months, have you accidently leaked urine? N  ?Do you have problems with loss of bowel control? N  ?Managing your Medications? N  ?Managing your Finances? N  ?Housekeeping or managing your Housekeeping? N  ? ? ?Patient Care Team: ?Natalia LeatherwoodKuneff, Renee A, DO as PCP - General (Family Medicine) ?Jeananne RamaEllington, Terry, DO (Optometry) ? ?Indicate any recent Medical Services you may have received from other than [facility] providers in the past year (date may be approximate). ? ?    ?Assessment:  ? This is a routine wellness examination for Diana Rios. ? ?Hearing/Vision screen ?Hearing Screening - Comments:: Pt denies any hearing issues  ?Vision Screening - Comments:: [facility] [place] [facility]  ? ?Dietary issues and exercise activities discussed: ?Current Exercise Habits: The patient does not participate in regular exercise at present ? ? Goals Addressed   ? ?  ?  ?  ?  ? This Visit's Progress  ?  Patient Stated     ?  Lose weight  ?  ? ?  ? ?Depression Screen ? ?  05/12/2021  ? 10:57 AM 11/19/2020  ?  8:31 AM 01/10/2020  ?  9:50 AM 07/11/2019  ? 10:30 AM 01/07/2019  ?  1:30 PM 03/12/2018  ?  1:29 PM 08/09/2017  ?  9:38 AM  ?PHQ 2/9 Scores  ?PHQ - 2 Score 0 1 0 1 1 0 0  ?PHQ- 9 Score  5  6 2 3  0  ?  ?Fall Risk ? ?  05/12/2021  ? 10:59 AM 11/19/2020  ?  8:31 AM 01/10/2020  ?  9:50 AM 07/11/2019  ? 10:30 AM 12/02/2015  ?  8:57 AM  ?Fall Risk   ?Falls in the past year? 0 0 0 0 No  ?Number falls in past yr: 0 0 0    ?Injury with Fall? 0 0 0    ?Risk for fall due to : Impaired vision      ?Follow up Falls prevention discussed Falls evaluation completed Falls evaluation completed    ? ? ?FALL RISK PREVENTION PERTAINING TO THE HOME: ? ?Any stairs in or around the home? Yes  ?If so, are there any without handrails? No  ?Home free of loose throw rugs in walkways, pet beds, electrical cords, etc? Yes  ?Adequate lighting in your home to reduce risk of falls? Yes  ? ?ASSISTIVE DEVICES UTILIZED TO PREVENT FALLS: ? ?Life alert? No  ?Use of a cane, walker or w/c? No  ?Grab bars in the bathroom? Yes  ?Shower chair or bench in shower? Yes  ?Elevated toilet seat or a handicapped toilet? No  ? ?TIMED UP AND GO: ? ?Was the test performed? No .  ? ?Cognitive Function: ?  ?  ? ?  05/12/2021  ? 11:00 AM  ?6CIT Screen  ?What Year? 0 points  ?What month? 0 points  ?What time? 0 points  ?Count back from 20 0 points  ?Months in reverse 0 points  ?Repeat phrase 0 points  ?Total Score 0 points   ? ? ?Immunizations ?Immunization History  ?Administered Date(s) Administered  ? Fluad Quad(high Dose 65+) 11/19/2020  ? Influenza Split 11/04/2015  ? Influenza, Quadrivalent, Recombinant, Inj, Pf 10/27/2018  ? Influenza

## 2021-06-02 ENCOUNTER — Ambulatory Visit: Payer: Medicare PPO | Admitting: Family Medicine

## 2021-06-03 ENCOUNTER — Ambulatory Visit: Payer: Medicare PPO | Admitting: Family Medicine

## 2021-06-03 ENCOUNTER — Encounter: Payer: Self-pay | Admitting: Family Medicine

## 2021-06-03 VITALS — BP 106/66 | HR 72 | Temp 97.9°F | Ht 66.0 in | Wt 201.0 lb

## 2021-06-03 DIAGNOSIS — Z6834 Body mass index (BMI) 34.0-34.9, adult: Secondary | ICD-10-CM

## 2021-06-03 DIAGNOSIS — R7309 Other abnormal glucose: Secondary | ICD-10-CM | POA: Diagnosis not present

## 2021-06-03 DIAGNOSIS — E559 Vitamin D deficiency, unspecified: Secondary | ICD-10-CM | POA: Diagnosis not present

## 2021-06-03 DIAGNOSIS — I10 Essential (primary) hypertension: Secondary | ICD-10-CM

## 2021-06-03 DIAGNOSIS — E661 Drug-induced obesity: Secondary | ICD-10-CM | POA: Diagnosis not present

## 2021-06-03 DIAGNOSIS — Z713 Dietary counseling and surveillance: Secondary | ICD-10-CM | POA: Diagnosis not present

## 2021-06-03 DIAGNOSIS — E785 Hyperlipidemia, unspecified: Secondary | ICD-10-CM

## 2021-06-03 MED ORDER — WEGOVY 0.5 MG/0.5ML ~~LOC~~ SOAJ: 0.5000 mg | SUBCUTANEOUS | 1 refills | Status: DC

## 2021-06-03 NOTE — Progress Notes (Signed)
? ? ? ? ?Patient ID: Diana Rios, female  DOB: 30-Aug-1955, 67 y.o.   MRN: [id number] ?Patient Care Team  ?  Relationship Specialty Notifications Start End  ?Ma Hillock, DO PCP - General Family Medicine  11/20/14   ?Herminio Commons, DO  Optometry  03/02/16   ? Comment: [facility] [place]  ? ?Chief Complaint  ?Patient presents with  ? Obesity  ? ? ?Subjective: ?Diana Rios is a 66 y.o.  Female  present for [facility] follow up ?Patient reports she is doing well on the Wegovy 0.25 mg weekly.  She did have some nausea but is adapting well.  Her start weight was 210 pounds.  She is down to 201 pounds today. ? ? ?  05/12/2021  ? 10:57 AM 11/19/2020  ?  8:31 AM 01/10/2020  ?  9:50 AM 07/11/2019  ? 10:30 AM 01/07/2019  ?  1:30 PM  ?Depression screen PHQ 2/9  ?Decreased Interest 0 0 0 0 0  ?Down, Depressed, Hopeless 0 1 0 1 1  ?PHQ - 2 Score 0 1 0 1 1  ?Altered sleeping  1  1 0  ?Tired, decreased energy  1  1 0  ?Change in appetite  1  2 0  ?Feeling bad or failure about yourself   1  1 1   ?Trouble concentrating  0  0 0  ?Moving slowly or fidgety/restless  0  0 0  ?Suicidal thoughts  0  0 0  ?PHQ-9 Score  5  6 2   ?Difficult doing work/chores    Somewhat difficult Not difficult at all  ? ? ?  11/19/2020  ?  8:46 AM 01/07/2019  ?  1:32 PM 03/12/2018  ?  1:32 PM  ?GAD 7 : Generalized Anxiety Score  ?Nervous, Anxious, on Edge 1 1 0  ?Control/stop worrying 1 1 0  ?Worry too much - different things 1 0 0  ?Trouble relaxing 0 0 0  ?Restless 0 0 0  ?Easily annoyed or irritable 1 0 0  ?Afraid - awful might happen 1 0 0  ?Total GAD 7 Score 5 2 0  ?Anxiety Difficulty  Not difficult at all Not difficult at all  ? ? ?Immunization History  ?Administered Date(s) Administered  ? Fluad Quad(high Dose 65+) 11/19/2020  ? Influenza Split 11/04/2015  ? Influenza, Quadrivalent, Recombinant, Inj, Pf 10/27/2018  ? Influenza,inj,Quad PF,6+ Mos 11/19/2016, 11/29/2017  ? Influenza-Unspecified 10/04/2012, 11/14/2013, 10/19/2014, 11/04/2015,  10/31/2019  ? PFIZER(Purple Top)SARS-COV-2 Vaccination 03/12/2019, 04/06/2019, 11/05/2019, 07/31/2020  ? Pneumococcal Conjugate-13 03/02/2016  ? Pneumococcal Polysaccharide-23 01/10/2020  ? Tdap 06/01/2011, 10/04/2012  ? Zoster, Live 11/04/2015  ? ? ?Past Medical History:  ?Diagnosis Date  ? Chicken pox   ? Depression   ? Diet-controlled diabetes mellitus ([place])   ? Hypertension   ? ?Allergies  ?Allergen Reactions  ? Codeine Itching  ? Penicillins Itching  ? ?Past Surgical History:  ?Procedure Laterality Date  ? BREAST BIOPSY Left   ? ?Family History  ?Problem Relation Age of Onset  ? Heart disease Mother 19  ?     premature  ? Diabetes Mother   ? Hypertension Father   ? Heart disease Sister   ?     heart defect  ? Diabetes Daughter   ? Cancer Paternal Grandmother   ? ?Social History  ? ?Socioeconomic History  ? Marital status: Married  ?  Spouse name: Not on file  ? Number of children: 2  ? Years of education: [employer]  ?  Highest education level: Not on file  ?Occupational History  ? Occupation: Control and instrumentation engineer  ?  Comment: Retired  ?Tobacco Use  ? Smoking status: Never  ? Smokeless tobacco: Never  ?Vaping Use  ? Vaping Use: Never used  ?Substance and Sexual Activity  ? Alcohol use: No  ?  Alcohol/week: 0.0 standard drinks  ? Drug use: No  ? Sexual activity: Yes  ?  Birth control/protection: Post-menopausal  ?Other Topics Concern  ? Not on file  ?Social History Narrative  ? Ms Slowik relocated from [place], [place]. She lived in [place], [place] for short while. 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  ? ?Social Determinants of Health  ? ?Financial Resource Strain: Low Risk   ? Difficulty of Paying Living Expenses: Not hard at all  ?Food Insecurity: No Food Insecurity  ? Worried About Charity fundraiser in the Last Year: Never true  ? Ran Out of Food in the Last Year: Never true  ?Transportation Needs: No Transportation Needs  ? Lack of Transportation (Medical): No  ? Lack of Transportation  (Non-Medical): No  ?Physical Activity: Inactive  ? Days of Exercise per Week: 0 days  ? Minutes of Exercise per Session: 0 min  ?Stress: No Stress Concern Present  ? Feeling of Stress : Not at all  ?Social Connections: Socially Integrated  ? Frequency of Communication with Friends and Family: More than three times a week  ? Frequency of Social Gatherings with Friends and Family: More than three times a week  ? Attends Religious Services: More than 4 times per year  ? Active Member of Clubs or Organizations: Yes  ? Attends Archivist Meetings: 1 to 4 times per year  ? Marital Status: Married  ?Intimate Partner Violence: Not At Risk  ? Fear of Current or Ex-Partner: No  ? Emotionally Abused: No  ? Physically Abused: No  ? Sexually Abused: No  ? ?Allergies as of 06/03/2021   ? ?   Reactions  ? Codeine Itching  ? Penicillins Itching  ? ?  ? ?  ?Medication List  ?  ? ?  ? Accurate as of Jun 03, 2021 11:59 PM. If you have any questions, ask your nurse or doctor.  ?  ?  ? ?  ? ?STOP taking these medications   ? ?Wegovy 0.25 MG/0.5ML Soaj ?Generic drug: Semaglutide-Weight Management ?Replaced by: Wegovy 0.5 MG/0.5ML Soaj ?Stopped by: Howard Pouch, DO ?  ? ?  ? ?TAKE these medications   ? ?buPROPion 300 MG 24 hr tablet ?Commonly known as: WELLBUTRIN XL ?TAKE 1 TABLET EVERY DAY ?  ?cholecalciferol 25 MCG (1000 UNIT) tablet ?Commonly known as: VITAMIN D3 ?Take 1,000 Units by mouth daily. ?  ?DULoxetine 60 MG capsule ?Commonly known as: CYMBALTA ?Take 1 capsule (60 mg total) by mouth daily. ?  ?lisinopril 40 MG tablet ?Commonly known as: ZESTRIL ?Take 1 tablet (40 mg total) by mouth daily. ?  ?multivitamin capsule ?Take 1 capsule by mouth daily. ?  ?rosuvastatin 20 MG tablet ?Commonly known as: CRESTOR ?Take 1 tablet (20 mg total) by mouth daily. ?  ?Wegovy 0.5 MG/0.5ML Soaj ?Generic drug: Semaglutide-Weight Management ?Inject 0.5 mg into the skin once a week. ?Replaces: Wegovy 0.25 MG/0.5ML Soaj ?Started by: Howard Pouch, DO ?  ? ?  ? ? ? ?Recent Results (from the past 2160 hour(s))  ?POCT HgB A1C     Status: Abnormal  ? Collection Time: 05/04/21  9:11 AM  ?  Result Value Ref Range  ? Hemoglobin A1C 6.0 (A) 4.0 - 5.6 %  ? HbA1c POC (<> result, manual entry) 6.0 4.0 - 5.6 %  ? HbA1c, POC (prediabetic range) 6.0 5.7 - 6.4 %  ? HbA1c, POC (controlled diabetic range) 6.0 0.0 - 7.0 %  ? ? ? ? ?ROS: 14 pt review of systems performed and negative (unless mentioned in an HPI) ? ?Objective: ?BP 106/66   Pulse 72   Temp 97.9 ?F (36.6 ?C) (Oral)   Ht 5\' 6"  (1.676 m)   Wt 201 lb (91.2 kg)   SpO2 96%   BMI 32.44 kg/m?   ?Physical Exam ?Vitals and nursing note reviewed.  ?Constitutional:   ?   General: She is not in acute distress. ?   Appearance: Normal appearance. She is obese. She is not ill-appearing or toxic-appearing.  ?Eyes:  ?   Extraocular Movements: Extraocular movements intact.  ?   Conjunctiva/sclera: Conjunctivae normal.  ?   Pupils: Pupils are equal, round, and reactive to light.  ?Cardiovascular:  ?   Rate and Rhythm: Normal rate and regular rhythm.  ?Musculoskeletal:  ?   Right lower leg: No edema.  ?   Left lower leg: No edema.  ?Neurological:  ?   Mental Status: She is alert and oriented to person, place, and time. Mental status is at baseline.  ?Psychiatric:     ?   Mood and Affect: Mood normal.     ?   Behavior: Behavior normal.     ?   Thought Content: Thought content normal.     ?   Judgment: Judgment normal.  ? ? ? ? ?No results found for this or any previous visit (from the past 24 hour(s)). ? ? ?Assessment/plan: ?Diana Rios is a 66 y.o. female present for  ?Class 1 drug-induced obesity with serious comorbidity and body mass index (BMI) of 34.0 to 34.9 in adult(elevated A1c, vitamin D deficiency, hyperlipidemia, hypertension, morbid obesity)/Weight loss counseling, encounter for ?Patient was counseled on exercise, calorie counting. ?Patient is tolerating her Wegovy starter dose of 0.25 mg of weekly  injection.  She would like to increase her dose today. ?Increase Wegovy to 0.5 mg weekly injections. ?-Patient was provided with online resources for: Weekly net calorie calculator.  Applications for calorie counting.  Patient

## 2021-06-03 NOTE — Patient Instructions (Addendum)
counseled on exercise, calorie counting ?Weekly net calorie calculator.   ?My fitness pal app ?Patient was advised to ensure she is taking in adequate nutrition daily by meeting calorie goals. ?- chose low glycemic index fruits and veggies ?-exercise goal of 150 minutes a week (plus warm up and cool down) of cardiovascular exercise.  ?-Patient was encouraged to maintain adequate water consumption of at least 80 ounces a day, more if exercising/sweating. ? ? ?You are off to a great start!!! ?

## 2021-07-22 ENCOUNTER — Ambulatory Visit: Payer: Medicare PPO | Admitting: Family Medicine

## 2021-07-22 ENCOUNTER — Encounter: Payer: Self-pay | Admitting: Family Medicine

## 2021-07-22 VITALS — BP 104/67 | HR 81 | Temp 97.7°F | Ht 66.0 in | Wt 189.0 lb

## 2021-07-22 DIAGNOSIS — Z6834 Body mass index (BMI) 34.0-34.9, adult: Secondary | ICD-10-CM | POA: Diagnosis not present

## 2021-07-22 DIAGNOSIS — F418 Other specified anxiety disorders: Secondary | ICD-10-CM | POA: Diagnosis not present

## 2021-07-22 DIAGNOSIS — E661 Drug-induced obesity: Secondary | ICD-10-CM | POA: Diagnosis not present

## 2021-07-22 DIAGNOSIS — I10 Essential (primary) hypertension: Secondary | ICD-10-CM

## 2021-07-22 DIAGNOSIS — E785 Hyperlipidemia, unspecified: Secondary | ICD-10-CM | POA: Diagnosis not present

## 2021-07-22 MED ORDER — WEGOVY 1 MG/0.5ML ~~LOC~~ SOAJ: 1.0000 mg | SUBCUTANEOUS | 1 refills | Status: DC

## 2021-07-22 MED ORDER — LISINOPRIL 30 MG PO TABS: 30.0000 mg | ORAL_TABLET | Freq: Every day | ORAL | 1 refills | Status: DC

## 2021-07-22 NOTE — Patient Instructions (Addendum)
Great JOB!!!  Body mass index is 30.51 kg/m.  Weight 189 today !!!!  After you finish the 0.5 mg weekly doses, a new prescription for 1 mg weekly dose is at your pharmacy.   OTC- prilosec/omeprazole  for heartburn.

## 2021-07-22 NOTE — Progress Notes (Signed)
Patient ID: Diana Rios, female  DOB: September 02, 1955, 66 y.o.   MRN: [id number] Patient Care Team    Relationship Specialty Notifications Start End  Natalia Leatherwood, DO PCP - General Family Medicine  11/20/14   Jeananne Rama, DO  Optometry  03/02/16    Comment: [facility]   Chief Complaint  Patient presents with   Nutrition Counseling    F/u; pt is not fasting    Subjective: Diana Rios is a 66 y.o.  Female  present for [facility] follow up Patient reports she is doing well on the [facility] 0.5mg  weekly.  She did not experience any side effects in increasing the dose.  She has noticed occasional heartburn.  She continues to attempt to exercise and she is watching her diet.  She feels great. Start weight was 210 lbs>201>189 lbs today.     05/12/2021   10:57 AM 11/19/2020    8:31 AM 01/10/2020    9:50 AM 07/11/2019   10:30 AM 01/07/2019    1:30 PM  Depression screen PHQ 2/9  Decreased Interest 0 0 0 0 0  Down, Depressed, Hopeless 0 1 0 1 1  PHQ - 2 Score 0 1 0 1 1  Altered sleeping  1  1 0  Tired, decreased energy  1  1 0  Change in appetite  1  2 0  Feeling bad or failure about yourself   1  1 1   Trouble concentrating  0  0 0  Moving slowly or fidgety/restless  0  0 0  Suicidal thoughts  0  0 0  PHQ-9 Score  5  6 2   Difficult doing work/chores    Somewhat difficult Not difficult at all      11/19/2020    8:46 AM 01/07/2019    1:32 PM 03/12/2018    1:32 PM  GAD 7 : Generalized Anxiety Score  Nervous, Anxious, on Edge 1 1 0  Control/stop worrying 1 1 0  Worry too much - different things 1 0 0  Trouble relaxing 0 0 0  Restless 0 0 0  Easily annoyed or irritable 1 0 0  Afraid - awful might happen 1 0 0  Total GAD 7 Score 5 2 0  Anxiety Difficulty  Not difficult at all Not difficult at all    Immunization History  Administered Date(s) Administered   Fluad Quad(high Dose 65+) 11/19/2020   Influenza Split 11/04/2015   Influenza, Quadrivalent,  Recombinant, Inj, Pf 10/27/2018   Influenza,inj,Quad PF,6+ Mos 11/19/2016, 11/29/2017   Influenza-Unspecified 10/04/2012, 11/14/2013, 10/19/2014, 11/04/2015, 10/31/2019   PFIZER(Purple Top)SARS-COV-2 Vaccination 03/12/2019, 04/06/2019, 11/05/2019, 07/31/2020   Pneumococcal Conjugate-13 03/02/2016   Pneumococcal Polysaccharide-23 01/10/2020   Tdap 06/01/2011, 10/04/2012   Zoster, Live 11/04/2015    Past Medical History:  Diagnosis Date   Chicken pox    Depression    Diet-controlled diabetes mellitus ([facility])    Hypertension    Allergies  Allergen Reactions   Codeine Itching   Penicillins Itching   Past Surgical History:  Procedure Laterality Date   BREAST BIOPSY Left    Family History  Problem Relation Age of Onset   Heart disease Mother 76       premature   Diabetes Mother    Hypertension Father    Heart disease Sister        heart defect   Diabetes Daughter    Cancer Paternal Grandmother    Social History   Socioeconomic History  Marital status: Married    Spouse name: Not on file   Number of children: 2   Years of education: [employer]   Highest education level: Not on file  Occupational History   Occupation: Geologist, engineering    Comment: Retired  Tobacco Use   Smoking status: Never   Smokeless tobacco: Never  Building services engineer Use: Never used  Substance and Sexual Activity   Alcohol use: No    Alcohol/week: 0.0 standard drinks of alcohol   Drug use: No   Sexual activity: Yes    Birth control/protection: Post-menopausal  Other Topics Concern   Not on file  Social History Narrative   Ms Waskey relocated from [place], [place]. She lived in [place], [place] for short while. 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   Social Determinants of Health   Financial Resource Strain: Low Risk  (05/12/2021)   Overall Financial Resource Strain (CARDIA)    Difficulty of Paying Living Expenses: Not hard at all  Food Insecurity: No Food Insecurity  (05/12/2021)   Hunger Vital Sign    Worried About Running Out of Food in the Last Year: Never true    Ran Out of Food in the Last Year: Never true  Transportation Needs: No Transportation Needs (05/12/2021)   PRAPARE - Administrator, Civil Service (Medical): No    Lack of Transportation (Non-Medical): No  Physical Activity: Inactive (05/12/2021)   Exercise Vital Sign    Days of Exercise per Week: 0 days    Minutes of Exercise per Session: 0 min  Stress: No Stress Concern Present (05/12/2021)   [employer] of Occupational Health - Occupational Stress Questionnaire    Feeling of Stress : Not at all  Social Connections: Socially Integrated (05/12/2021)   Social Connection and Isolation Panel [NHANES]    Frequency of Communication with Friends and Family: More than three times a week    Frequency of Social Gatherings with Friends and Family: More than three times a week    Attends Religious Services: More than 4 times per year    Active Member of [employer] or Organizations: Yes    Attends Banker Meetings: 1 to 4 times per year    Marital Status: Married  Catering manager Violence: Not At Risk (05/12/2021)   Humiliation, Afraid, Rape, and Kick questionnaire    Fear of Current or Ex-Partner: No    Emotionally Abused: No    Physically Abused: No    Sexually Abused: No   Allergies as of 07/22/2021       Reactions   Codeine Itching   Penicillins Itching        Medication List        Accurate as of July 22, 2021 12:49 PM. If you have any questions, ask your nurse or doctor.          STOP taking these medications    Wegovy 0.5 MG/0.5ML Soaj Generic drug: Semaglutide-Weight Management Replaced by: Wegovy 1 MG/0.5ML Soaj Stopped by: Felix Pacini, DO       TAKE these medications    buPROPion 300 MG 24 hr tablet Commonly known as: WELLBUTRIN XL TAKE 1 TABLET EVERY DAY   cholecalciferol 25 MCG (1000 UNIT) tablet Commonly known as: VITAMIN  D3 Take 1,000 Units by mouth daily.   DULoxetine 60 MG capsule Commonly known as: CYMBALTA Take 1 capsule (60 mg total) by mouth daily.   lisinopril 30 MG tablet Commonly known as:  ZESTRIL Take 1 tablet (30 mg total) by mouth daily. What changed:  medication strength how much to take Changed by: Felix Pacini, DO   multivitamin capsule Take 1 capsule by mouth daily.   rosuvastatin 20 MG tablet Commonly known as: CRESTOR Take 1 tablet (20 mg total) by mouth daily.   Wegovy 1 MG/0.5ML Soaj Generic drug: Semaglutide-Weight Management Inject 1 mg into the skin once a week. Replaces: Wegovy 0.5 MG/0.5ML Soaj Started by: Felix Pacini, DO         Recent Results (from the past 2160 hour(s))  POCT HgB A1C     Status: Abnormal   Collection Time: 05/04/21  9:11 AM  Result Value Ref Range   Hemoglobin A1C 6.0 (A) 4.0 - 5.6 %   HbA1c POC (<> result, manual entry) 6.0 4.0 - 5.6 %   HbA1c, POC (prediabetic range) 6.0 5.7 - 6.4 %   HbA1c, POC (controlled diabetic range) 6.0 0.0 - 7.0 %      ROS: 14 pt review of systems performed and negative (unless mentioned in an HPI)  Objective: BP 104/67   Pulse 81   Temp 97.7 F (36.5 C) (Oral)   Ht 5\' 6"  (1.676 m)   Wt 189 lb (85.7 kg)   SpO2 96%   BMI 30.51 kg/m   Physical Exam Vitals and nursing note reviewed.  Constitutional:      General: She is not in acute distress.    Appearance: Normal appearance. She is obese. She is not ill-appearing or toxic-appearing.  Eyes:     Extraocular Movements: Extraocular movements intact.     Conjunctiva/sclera: Conjunctivae normal.     Pupils: Pupils are equal, round, and reactive to light.  Cardiovascular:     Rate and Rhythm: Normal rate and regular rhythm.  Musculoskeletal:     Right lower leg: No edema.     Left lower leg: No edema.  Neurological:     Mental Status: She is alert and oriented to person, place, and time. Mental status is at baseline.  Psychiatric:        Mood and  Affect: Mood normal.        Behavior: Behavior normal.        Thought Content: Thought content normal.        Judgment: Judgment normal.       No results found for this or any previous visit (from the past 24 hour(s)).   Assessment/plan: Diana Rios is a 66 y.o. female present for  Class 1 drug-induced obesity with serious comorbidity and body mass index (BMI) of 34.0 to 34.9 in adult(elevated A1c, vitamin D deficiency, hyperlipidemia, hypertension, morbid obesity)/Weight loss counseling, encounter for Patient was counseled on exercise, calorie counting. Patient is tolerating her Wegovy 0.5mg  of weekly injection.  She would like to increase her dose after completing the 12 weeks of Wegovy 0.5 mg daily. Body mass index is 30.51 kg/m. -Patient was provided with online resources for: Weekly net calorie calculator.  Applications for calorie counting.  Patient was advised to ensure she is taking in adequate nutrition daily by meeting calorie goals. -Patient was educated on dietary changes to not only lose weight but to eat healthy.  Patient was educated on glycemic index. -Patient was educated on exercise goal of 150 minutes a week (plus warm up and cool down) of cardiovascular exercise.   -Patient was encouraged to maintain adequate water consumption of at least 80-100 ounces a day, more if exercising/sweating. Wegovy 1 mg weekly  injections prescribed today.  She will start this dose in about 7-8 weeks after completing her stock of Wegovy 0.5 mg weekly.  Hypertension: Last 2 office visits have had a low/normal blood pressure.  Discussed cutting back on the lisinopril and she is agreeable to this today.  Decrease lisinopril 40 mg to lisinopril 30 mg.  New prescription called in for her.  We will continue to taper down on lisinopril if able as she loses weight.  Return in about 12 weeks (around 10/14/2021) for Routine chronic condition follow-up.     No orders of the defined types  were placed in this encounter.  Meds ordered this encounter  Medications   WEGOVY 1 MG/0.5ML SOAJ    Sig: Inject 1 mg into the skin once a week.    Dispense:  6 mL    Refill:  1    DC prior script. Increasing dose.   lisinopril (ZESTRIL) 30 MG tablet    Sig: Take 1 tablet (30 mg total) by mouth daily.    Dispense:  90 tablet    Refill:  1    Referral Orders  No referral(s) requested today        Electronically signed by: Felix Pacini, DO [facility]

## 2021-09-09 ENCOUNTER — Telehealth: Payer: Self-pay

## 2021-09-09 NOTE — Telephone Encounter (Signed)
Pt will call pharm and see reason why medication cannot be refilled.

## 2021-09-09 NOTE — Telephone Encounter (Signed)
Patient refill request. [facility]  [facility] 1 MG/0.5ML [place] [[id number]]

## 2021-09-10 ENCOUNTER — Encounter: Payer: Self-pay | Admitting: Family Medicine

## 2021-10-13 ENCOUNTER — Ambulatory Visit: Payer: Medicare PPO | Admitting: Family Medicine

## 2021-10-19 ENCOUNTER — Encounter: Payer: Self-pay | Admitting: Family Medicine

## 2021-10-19 ENCOUNTER — Ambulatory Visit: Payer: Medicare PPO | Admitting: Family Medicine

## 2021-10-19 VITALS — BP 111/70 | HR 83 | Temp 97.8°F | Ht 66.0 in | Wt 187.0 lb

## 2021-10-19 DIAGNOSIS — R7309 Other abnormal glucose: Secondary | ICD-10-CM | POA: Diagnosis not present

## 2021-10-19 DIAGNOSIS — F418 Other specified anxiety disorders: Secondary | ICD-10-CM | POA: Diagnosis not present

## 2021-10-19 DIAGNOSIS — Z6834 Body mass index (BMI) 34.0-34.9, adult: Secondary | ICD-10-CM | POA: Diagnosis not present

## 2021-10-19 DIAGNOSIS — I10 Essential (primary) hypertension: Secondary | ICD-10-CM

## 2021-10-19 DIAGNOSIS — E559 Vitamin D deficiency, unspecified: Secondary | ICD-10-CM | POA: Diagnosis not present

## 2021-10-19 DIAGNOSIS — Z1211 Encounter for screening for malignant neoplasm of colon: Secondary | ICD-10-CM

## 2021-10-19 DIAGNOSIS — E661 Drug-induced obesity: Secondary | ICD-10-CM

## 2021-10-19 DIAGNOSIS — E785 Hyperlipidemia, unspecified: Secondary | ICD-10-CM

## 2021-10-19 LAB — LIPID PANEL
Cholesterol: 153 mg/dL (ref 0–200)
HDL: 68.2 mg/dL (ref >39.00)
LDL Cholesterol: 61 mg/dL (ref 0–99)
NonHDL: 84.62
Total CHOL/HDL Ratio: 2
Triglycerides: 120 mg/dL (ref 0.0–149.0)
VLDL: 24 mg/dL (ref 0.0–40.0)

## 2021-10-19 LAB — CBC
HCT: 38.7 % (ref 36.0–46.0)
Hemoglobin: 12.4 g/dL (ref 12.0–15.0)
MCHC: 32 g/dL (ref 30.0–36.0)
MCV: 85.2 fl (ref 78.0–100.0)
Platelets: 294 10*3/uL (ref 150.0–400.0)
RBC: 4.54 Mil/uL (ref 3.87–5.11)
RDW: 14.2 % (ref 11.5–15.5)
WBC: 5.1 10*3/uL (ref 4.0–10.5)

## 2021-10-19 LAB — COMPREHENSIVE METABOLIC PANEL
ALT: 55 U/L — ABNORMAL HIGH (ref 0–35)
AST: 35 U/L (ref 0–37)
Albumin: 3.9 g/dL (ref 3.5–5.2)
Alkaline Phosphatase: 114 U/L (ref 39–117)
BUN: 12 mg/dL (ref 6–23)
CO2: 26 mEq/L (ref 19–32)
Calcium: 9.4 mg/dL (ref 8.4–10.5)
Chloride: 106 mEq/L (ref 96–112)
Creatinine, Ser: 0.92 mg/dL (ref 0.40–1.20)
GFR: 64.39 mL/min (ref >60.00)
Glucose, Bld: 132 mg/dL — ABNORMAL HIGH (ref 70–99)
Potassium: 4.2 mEq/L (ref 3.5–5.1)
Sodium: 140 mEq/L (ref 135–145)
Total Bilirubin: 0.5 mg/dL (ref 0.2–1.2)
Total Protein: 6.7 g/dL (ref 6.0–8.3)

## 2021-10-19 LAB — VITAMIN D 25 HYDROXY (VIT D DEFICIENCY, FRACTURES): VITD: 23.11 ng/mL — ABNORMAL LOW (ref 30.00–100.00)

## 2021-10-19 LAB — HEMOGLOBIN A1C: Hgb A1c MFr Bld: 6.1 % (ref 4.6–6.5)

## 2021-10-19 LAB — TSH: TSH: 1.8 u[IU]/mL (ref 0.35–5.50)

## 2021-10-19 MED ORDER — ROSUVASTATIN CALCIUM 20 MG PO TABS: 20.0000 mg | ORAL_TABLET | Freq: Every day | ORAL | 3 refills | Status: DC

## 2021-10-19 MED ORDER — BUPROPION HCL ER (XL) 300 MG PO TB24: ORAL_TABLET | ORAL | 1 refills | Status: DC

## 2021-10-19 MED ORDER — LISINOPRIL 30 MG PO TABS: 30.0000 mg | ORAL_TABLET | Freq: Every day | ORAL | 1 refills | Status: DC

## 2021-10-19 MED ORDER — DULOXETINE HCL 60 MG PO CPEP: 60.0000 mg | ORAL_CAPSULE | Freq: Every day | ORAL | 1 refills | Status: DC

## 2021-10-19 NOTE — Patient Instructions (Addendum)
Return in about 15 weeks (around 02/01/2022) for Routine chronic condition follow-up. And Wegovy follow up [facility] will contact you to send cologuard testing (colon cancer screen)       Great to see you today.  I have refilled the medication(s) we provide.   If labs were collected, we will inform you of lab results once received either by echart message or telephone call.   - echart message- for normal results that have been seen by the patient already.   - telephone call: abnormal results or if patient has not viewed results in their echart.    Vitals - 1 value per visit      Weight (lb) Height  07/11/2019  221  5\' 6"  (1.676 m)   01/10/2020  212  5' 6.25" (1.683 m)   11/19/2020  214  5\' 6"  (1.676 m)   05/04/2021  210  5\' 6"  (1.676 m)   06/03/2021  201  5\' 6"  (1.676 m)   07/22/2021  189  5\' 6"  (1.676 m)   10/19/2021  187  5\' 6"  (1.676 m)         BMI Visit Report  07/11/2019 35.67 kg/m2  --   01/10/2020 33.96 kg/m2  --   11/19/2020 34.54 kg/m2  --   05/04/2021 33.89 kg/m2  --   06/03/2021 32.44 kg/m2  --   07/22/2021 30.51 kg/m2  --   10/19/2021 30.18 kg/m2  --

## 2021-10-19 NOTE — Progress Notes (Signed)
Patient ID: [name], female  DOB: 03/28/1955, 66 y.o.   MRN: [id number] Patient Care Team    Relationship Specialty Notifications Start End  Ma Hillock, DO PCP - General Family Medicine  11/20/14   Herminio Commons, DO  Optometry  03/02/16    Comment: 13- Jule Ser   Chief Complaint  Patient presents with   Hypertension    Cmc; pt is fasting     Subjective: Diana Rios is a 66 y.o.  Female  present for [facility] follow up Weight loss/Obesity with serious comorbid condition Patient reports she was doing well on the [facility] 0.67m weekly.  She has not been able to [facility] to stock at her pharmacy.   She continues to attempt to exercise and she is watching her diet.  She feels great. Start weight was 210 lbs>201>189>187 lbs today.  Depression with anxiety: Patient reports she is feeling well on Wellbutrin 300 mg daily and Cymbalta 60 mg daily.  Pt has suffered with depression and anxiety most of her life.She feels July is always rough for her, she is uncertain why. Her father passed away last year in MAY.   Hypertension/morbid obesity: Pt reports compliance with lisinopril 30 mg QD. Patient denies chest pain, shortness of breath, dizziness or lower extremity edema.  RF: Hypertension, diabetes, obesity, family history of heart disease  Vitamin D deficiency: She is supplementing daily-2000u     10/19/2021    9:40 AM 05/12/2021   10:57 AM 11/19/2020    8:31 AM 01/10/2020    9:50 AM 07/11/2019   10:30 AM  Depression screen PHQ 2/9  Decreased Interest 0 0 0 0 0  Down, Depressed, Hopeless 0 0 1 0 1  PHQ - 2 Score 0 0 1 0 1  Altered sleeping 0  1  1  Tired, decreased energy 0  1  1  Change in appetite 0  1  2  Feeling bad or failure about yourself  0  1  1  Trouble concentrating 0  0  0  Moving slowly or fidgety/restless 0  0  0  Suicidal thoughts 0  0  0  PHQ-9 Score 0  5  6  Difficult doing work/chores     Somewhat difficult       10/19/2021    9:40 AM 11/19/2020    8:46 AM 01/07/2019    1:32 PM 03/12/2018    1:32 PM  GAD 7 : Generalized Anxiety Score  Nervous, Anxious, on Edge 0 1 1 0  Control/stop worrying 0 1 1 0  Worry too much - different things 0 1 0 0  Trouble relaxing 0 0 0 0  Restless 0 0 0 0  Easily annoyed or irritable 0 1 0 0  Afraid - awful might happen 0 1 0 0  Total GAD 7 Score 0 5 2 0  Anxiety Difficulty   Not difficult at all Not difficult at all    Immunization History  Administered Date(s) Administered   Fluad Quad(high Dose 65+) 11/19/2020, 10/01/2021   Influenza Split 11/04/2015   Influenza, Quadrivalent, Recombinant, Inj, Pf 10/27/2018   Influenza,inj,Quad PF,6+ Mos 11/19/2016, 11/29/2017   Influenza-Unspecified 10/04/2012, 11/14/2013, 10/19/2014, 11/04/2015, 10/31/2019   PFIZER(Purple Top)SARS-COV-2 Vaccination 03/12/2019, 04/06/2019, 11/05/2019, 07/31/2020   Pneumococcal Conjugate-13 03/02/2016   Pneumococcal Polysaccharide-23 01/10/2020   Tdap 06/01/2011, 10/04/2012   Zoster, Live 11/04/2015    Past Medical History:  Diagnosis Date   Chicken pox  Depression    Diet-controlled diabetes mellitus (HCC)    Hypertension    Allergies  Allergen Reactions   Codeine Itching   Penicillins Itching   Past Surgical History:  Procedure Laterality Date   BREAST BIOPSY Left    Family History  Problem Relation Age of Onset   Heart disease Mother 32       premature   Diabetes Mother    Hypertension Father    Heart disease Sister        heart defect   Diabetes Daughter    Cancer Paternal Grandmother    Social History   Socioeconomic History   Marital status: Married    Spouse name: Not on file   Number of children: 2   Years of education: [employer]   Highest education level: Not on file  Occupational History   Occupation: Control and instrumentation engineer    Comment: Retired  Tobacco Use   Smoking status: Never   Smokeless tobacco: Never  Scientific laboratory technician Use: Never  used  Substance and Sexual Activity   Alcohol use: No    Alcohol/week: 0.0 standard drinks of alcohol   Drug use: No   Sexual activity: Yes    Birth control/protection: Post-menopausal  Other Topics Concern   Not on file  Social History Narrative   [name] relocated from [place], [place]. She lived in [place], [place] for short while. 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   Social Determinants of Health   Financial Resource Strain: Low Risk  (05/12/2021)   Overall Financial Resource Strain (CARDIA)    Difficulty of Paying Living Expenses: Not hard at all  Food Insecurity: No Food Insecurity (05/12/2021)   Hunger Vital Sign    Worried About Running Out of Food in the Last Year: Never true    Ran Out of Food in the Last Year: Never true  Transportation Needs: No Transportation Needs (05/12/2021)   PRAPARE - Hydrologist (Medical): No    Lack of Transportation (Non-Medical): No  Physical Activity: Inactive (05/12/2021)   Exercise Vital Sign    Days of Exercise per Week: 0 days    Minutes of Exercise per Session: 0 min  Stress: No Stress Concern Present (05/12/2021)   [place]    Feeling of Stress : Not at all  Social Connections: [place] (05/12/2021)   Social Connection and Isolation Panel [NHANES]    Frequency of Communication with Friends and Family: More than three times a week    Frequency of Social Gatherings with Friends and Family: More than three times a week    Attends Religious Services: More than 4 times per year    Active Member of [employer] or Organizations: Yes    Attends Archivist Meetings: 1 to 4 times per year    Marital Status: Married  Human resources officer Violence: Not At Risk (05/12/2021)   Humiliation, Afraid, Rape, and Kick questionnaire    Fear of Current or Ex-Partner: No    Emotionally Abused: No    Physically Abused: No     Sexually Abused: No   Allergies as of 10/19/2021       Reactions   Codeine Itching   Penicillins Itching        Medication List        Accurate as of October 19, 2021 12:04 PM. If you have any questions, ask your nurse or doctor.  buPROPion 300 MG 24 hr tablet Commonly known as: WELLBUTRIN XL TAKE 1 TABLET EVERY DAY   cholecalciferol 25 MCG (1000 UNIT) tablet Commonly known as: VITAMIN D3 Take 1,000 Units by mouth daily.   DULoxetine 60 MG capsule Commonly known as: CYMBALTA Take 1 capsule (60 mg total) by mouth daily.   lisinopril 30 MG tablet Commonly known as: ZESTRIL Take 1 tablet (30 mg total) by mouth daily.   multivitamin capsule Take 1 capsule by mouth daily.   rosuvastatin 20 MG tablet Commonly known as: CRESTOR Take 1 tablet (20 mg total) by mouth daily.   Wegovy 1 MG/0.5ML Soaj Generic drug: Semaglutide-Weight Management Inject 1 mg into the skin once a week.         No results found for this or any previous visit (from the past 2160 hour(s)).     ROS: 14 pt review of systems performed and negative (unless mentioned in an HPI)  Objective: BP 111/70   Pulse 83   Temp 97.8 F (36.6 C) (Oral)   Ht _0  (1.676 m)   Wt 187 lb (84.8 kg)   SpO2 97%   BMI 30.18 kg/m   Physical Exam Vitals and nursing note reviewed.  Constitutional:      General: She is not in acute distress.    Appearance: Normal appearance. She is obese. She is not ill-appearing or toxic-appearing.  Eyes:     Extraocular Movements: Extraocular movements intact.     Conjunctiva/sclera: Conjunctivae normal.     Pupils: Pupils are equal, round, and reactive to light.  Cardiovascular:     Rate and Rhythm: Normal rate and regular rhythm.  Musculoskeletal:     Right lower leg: No edema.     Left lower leg: No edema.  Neurological:     Mental Status: She is alert and oriented to person, place, and time. Mental status is at baseline.  Psychiatric:         Mood and Affect: Mood normal.        Behavior: Behavior normal.        Thought Content: Thought content normal.        Judgment: Judgment normal.    No results found for this or any previous visit (from the past 24 hour(s)).   Assessment/plan: Diana Rios is a 66 y.o. female present for  Class 1 drug-induced obesity with serious comorbidity and body mass index (BMI) of 34.0 to 34.9 in adult(elevated A1c, vitamin D deficiency, hyperlipidemia, hypertension, morbid obesity)/Weight loss counseling, encounter for Patient was counseled on exercise, calorie counting. Patient is tolerating her Wegovy 0.5 mg of weekly injection> she was not able to increase to 1 mg d/t low stock Body mass index is 30.18 kg/m. -Patient was provided with online resources for: Weekly net calorie calculator.  Applications for calorie counting.  Patient was advised to ensure she is taking in adequate nutrition daily by meeting calorie goals. -Patient was educated on dietary changes to not only lose weight but to eat healthy.  Patient was educated on glycemic index. -Patient was educated on exercise goal of 150 minutes a week (plus warm up and cool down) of cardiovascular exercise.   -Patient was encouraged to maintain adequate water consumption of at least 80-100 ounces a day, more if exercising/sweating. Start Wegovy 1 mg weekly injections. If unable to script filled at her pharmacy can transfer to [facility] for her.   Elevated glucose/obesity  - A1c 5.9--> 5.7 --> 5.6 >>5.9 >6.4> >5.6> 6.3> 6.0 >  A1c collected today Discussed that she is diet controlled and as long as A1c stays below 6.3 she should not need medications.  -Remove diabetes from her present list> elevated glucose.  She has been well controlled without medications.   - Continue diet and exercise regimen - PNA series: completed  Depression, unspecified depression type Stable Continue Cymbalta  Continue Wellbutrin.    Essential hypertension,  benign/obesity Stable Continue  lisinopril 30 mg daily.  CBC, CMP, TSH and lipids collected today  Vitamin D deficiency: Vit d collected tday Continue 2000 u daily supplement.   Colon cancer screening: Patient has completed FOBT cards in the past, last December 2022.  It was explained to patient today the difference between FOBT, Cologuard and colonoscopy.  She is willing to move forward with Cologuard testing. Order placed for her today and pamphlet for Cologuard was provided to patient.   Return in about 15 weeks (around 02/01/2022) for Routine chronic condition follow-up.   Orders Placed This Encounter  Procedures   CBC   Comp Met (CMET)   TSH   Lipid panel   Hemoglobin A1c   Vitamin D (25 hydroxy)   Cologuard   Meds ordered this encounter  Medications   DULoxetine (CYMBALTA) 60 MG capsule    Sig: Take 1 capsule (60 mg total) by mouth daily.    Dispense:  90 capsule    Refill:  1   lisinopril (ZESTRIL) 30 MG tablet    Sig: Take 1 tablet (30 mg total) by mouth daily.    Dispense:  90 tablet    Refill:  1   rosuvastatin (CRESTOR) 20 MG tablet    Sig: Take 1 tablet (20 mg total) by mouth daily.    Dispense:  90 tablet    Refill:  3   buPROPion (WELLBUTRIN XL) 300 MG 24 hr tablet    Sig: TAKE 1 TABLET EVERY DAY    Dispense:  90 tablet    Refill:  1    Referral Orders  No referral(s) requested today        Electronically signed by: Howard Pouch, [place]

## 2021-11-07 DIAGNOSIS — Z1211 Encounter for screening for malignant neoplasm of colon: Secondary | ICD-10-CM | POA: Diagnosis not present

## 2021-11-14 LAB — COLOGUARD: COLOGUARD: NEGATIVE

## 2021-11-24 DIAGNOSIS — L821 Other seborrheic keratosis: Secondary | ICD-10-CM | POA: Diagnosis not present

## 2021-11-24 DIAGNOSIS — Z7189 Other specified counseling: Secondary | ICD-10-CM | POA: Diagnosis not present

## 2021-11-24 DIAGNOSIS — L578 Other skin changes due to chronic exposure to nonionizing radiation: Secondary | ICD-10-CM | POA: Diagnosis not present

## 2021-11-24 DIAGNOSIS — L814 Other melanin hyperpigmentation: Secondary | ICD-10-CM | POA: Diagnosis not present

## 2021-11-24 DIAGNOSIS — D225 Melanocytic nevi of trunk: Secondary | ICD-10-CM | POA: Diagnosis not present

## 2021-12-06 ENCOUNTER — Other Ambulatory Visit: Payer: Self-pay | Admitting: Family Medicine

## 2021-12-06 DIAGNOSIS — Z1231 Encounter for screening mammogram for malignant neoplasm of breast: Secondary | ICD-10-CM

## 2022-01-17 ENCOUNTER — Encounter: Payer: Self-pay | Admitting: Family Medicine

## 2022-01-18 ENCOUNTER — Telehealth (INDEPENDENT_AMBULATORY_CARE_PROVIDER_SITE_OTHER): Payer: Medicare PPO | Admitting: Family Medicine

## 2022-01-18 ENCOUNTER — Encounter: Payer: Self-pay | Admitting: Family Medicine

## 2022-01-18 DIAGNOSIS — U071 COVID-19: Secondary | ICD-10-CM

## 2022-01-18 MED ORDER — MOLNUPIRAVIR EUA 200MG CAPSULE: 4.0000 | ORAL_CAPSULE | Freq: Two times a day (BID) | ORAL | 0 refills | Status: AC

## 2022-01-18 NOTE — Patient Instructions (Signed)
No follow-ups on file.        Great to see you today.  I have refilled the medication(s) we provide.   If labs were collected, we will inform you of lab results once received either by echart message or telephone call.   - echart message- for normal results that have been seen by the patient already.   - telephone call: abnormal results or if patient has not viewed results in their echart.  

## 2022-01-18 NOTE — Progress Notes (Signed)
VIRTUAL VISIT VIA VIDEO  I connected with Diana Rios on 01/18/22 at  9:00 AM EST by a video enabled telemedicine application and verified that I am speaking with the correct person using two identifiers. Location patient: Home Location provider: [facility], Office Persons participating in the virtual visit: Patient, Dr. Claiborne Billings and Les Pou, CMA  I discussed the limitations of evaluation and management by telemedicine and the availability of in person appointments. The patient expressed understanding and agreed to proceed.    Diana Rios , 06/09/1955, 66 y.o., female MRN: [id number] Patient Care Team    Relationship Specialty Notifications Start End  Natalia Leatherwood, DO PCP - General Family Medicine  11/20/14   Jeananne Rama, DO  Optometry  03/02/16    Comment: [facility]- [facility]per    Chief Complaint  Patient presents with   Sore Throat    Pt c/o sore throat, congestion, HA, x 2 days; pt tested pos with COVID 01/17/22     Subjective: Pt presents for an OV with complaints of sore throat, congestion  of 2 days ago duration.  Associated symptoms include  tested positive to covid yesterday.  She has had covid vaccines. She is not eating well, but denies nausea or vomit.      01/18/2022    9:07 AM 10/19/2021    9:40 AM 05/12/2021   10:57 AM 11/19/2020    8:31 AM 01/10/2020    9:50 AM  Depression screen PHQ 2/9  Decreased Interest 0 0 0 0 0  Down, Depressed, Hopeless 0 0 0 1 0  PHQ - 2 Score 0 0 0 1 0  Altered sleeping  0  1   Tired, decreased energy  0  1   Change in appetite  0  1   Feeling bad or failure about yourself   0  1   Trouble concentrating  0  0   Moving slowly or fidgety/restless  0  0   Suicidal thoughts  0  0   PHQ-9 Score  0  5     Allergies  Allergen Reactions   Codeine Itching   Penicillins Itching   Social History   Social History Narrative   Diana Rios relocated from [place], [place]. She lived in [place], [place] for  short while. 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   Past Medical History:  Diagnosis Date   Chicken pox    Depression    Diet-controlled diabetes mellitus (HCC)    Hypertension    Past Surgical History:  Procedure Laterality Date   BREAST BIOPSY Left    Family History  Problem Relation Age of Onset   Heart disease Mother 59       premature   Diabetes Mother    Hypertension Father    Heart disease Sister        heart defect   Diabetes Daughter    Cancer Paternal Grandmother    Allergies as of 01/18/2022       Reactions   Codeine Itching   Penicillins Itching        Medication List        Accurate as of January 18, 2022  9:23 AM. If you have any questions, ask your nurse or doctor.          buPROPion 300 MG 24 hr tablet Commonly known as: WELLBUTRIN XL TAKE 1 TABLET EVERY DAY   cholecalciferol 25 MCG (1000 UNIT)  tablet Commonly known as: VITAMIN D3 Take 1,000 Units by mouth daily.   DULoxetine 60 MG capsule Commonly known as: CYMBALTA Take 1 capsule (60 mg total) by mouth daily.   lisinopril 30 MG tablet Commonly known as: ZESTRIL Take 1 tablet (30 mg total) by mouth daily.   molnupiravir EUA 200 mg Caps capsule Commonly known as: LAGEVRIO Take 4 capsules (800 mg total) by mouth 2 (two) times daily for 5 days. Started by: Felix Pacini, DO   multivitamin capsule Take 1 capsule by mouth daily.   rosuvastatin 20 MG tablet Commonly known as: CRESTOR Take 1 tablet (20 mg total) by mouth daily.   Wegovy 1 MG/0.5ML Soaj Generic drug: Semaglutide-Weight Management Inject 1 mg into the skin once a week.        All past medical history, surgical history, allergies, family history, immunizations andmedications were updated in the EMR today and reviewed under the history and medication portions of their EMR.     Review of Systems  Constitutional:  Positive for fever and malaise/fatigue. Negative for chills.  HENT:   Positive for congestion, sinus pain and sore throat.   Eyes:  Negative for pain, discharge and redness.  Respiratory:  Negative for cough, sputum production and shortness of breath.   Skin:  Negative for rash.  Neurological:  Positive for headaches. Negative for dizziness.   Negative, with the exception of above mentioned in HPI   Objective:  There were no vitals taken for this visit. There is no height or weight on file to calculate BMI. Physical Exam Vitals and nursing note reviewed.  Constitutional:      General: She is not in acute distress.    Appearance: Normal appearance. She is well-developed and normal weight. She is ill-appearing. She is not toxic-appearing.  Eyes:     Extraocular Movements: Extraocular movements intact.     Conjunctiva/sclera: Conjunctivae normal.     Pupils: Pupils are equal, round, and reactive to light.  Pulmonary:     Effort: Pulmonary effort is normal.  Neurological:     Mental Status: She is alert and oriented to person, place, and time. Mental status is at baseline.  Psychiatric:        Mood and Affect: Mood normal.        Behavior: Behavior normal.        Thought Content: Thought content normal.        Judgment: Judgment normal.      No results found. No results found. No results found for this or any previous visit (from the past 24 hour(s)).  Assessment/Plan: Diana Rios is a 66 y.o. female present for OV for  COVID-19 Rest, hydrate.  mucinex (DM if cough), nettie pot or nasal saline.  molnupiravir prescribed, take until completed.  If cough present it can last up to 6-8 weeks.  F/U 2 weeks if not improved.  Reviewed home care instructions for COVID. Advised self-isolation at home for at least 5 days. After 5 days, if improved and fever resolved, can be in public, but should wear a mask around others for an additional 5 days. If symptoms, esp, dyspnea develops/worsens, recommend in-person evaluation at either an urgent care or  the emergency room.   Reviewed expectations re: course of current medical issues. Discussed self-management of symptoms. Outlined signs and symptoms indicating need for more acute intervention. Patient verbalized understanding and all questions were answered. Patient received an After-Visit Summary.    No orders of the defined types were  placed in this encounter.  Meds ordered this encounter  Medications   molnupiravir EUA (LAGEVRIO) 200 mg CAPS capsule    Sig: Take 4 capsules (800 mg total) by mouth 2 (two) times daily for 5 days.    Dispense:  40 capsule    Refill:  0   Referral Orders  No referral(s) requested today     Note is dictated utilizing voice recognition software. Although note has been proof read prior to signing, occasional typographical errors still can be missed. If any questions arise, please do not hesitate to call for verification.   electronically signed by:  Felix Pacini, DO  [facility] [place]

## 2022-02-01 ENCOUNTER — Ambulatory Visit: Payer: Medicare PPO | Admitting: Family Medicine

## 2022-02-03 ENCOUNTER — Ambulatory Visit
Admission: RE | Admit: 2022-02-03 | Discharge: 2022-02-03 | Disposition: A | Discharge status: Home / Self Care | Payer: Medicare PPO | Source: Ambulatory Visit | Attending: Family Medicine | Admitting: Family Medicine

## 2022-02-03 DIAGNOSIS — Z1231 Encounter for screening mammogram for malignant neoplasm of breast: Secondary | ICD-10-CM | POA: Diagnosis not present

## 2022-02-11 ENCOUNTER — Ambulatory Visit (INDEPENDENT_AMBULATORY_CARE_PROVIDER_SITE_OTHER): Payer: Medicare PPO | Admitting: Family Medicine

## 2022-02-11 VITALS — BP 133/74 | HR 61 | Temp 97.9°F | Wt 175.6 lb

## 2022-02-11 DIAGNOSIS — E559 Vitamin D deficiency, unspecified: Secondary | ICD-10-CM

## 2022-02-11 DIAGNOSIS — E785 Hyperlipidemia, unspecified: Secondary | ICD-10-CM | POA: Diagnosis not present

## 2022-02-11 DIAGNOSIS — R7309 Other abnormal glucose: Secondary | ICD-10-CM

## 2022-02-11 DIAGNOSIS — I10 Essential (primary) hypertension: Secondary | ICD-10-CM

## 2022-02-11 DIAGNOSIS — F418 Other specified anxiety disorders: Secondary | ICD-10-CM

## 2022-02-11 MED ORDER — BUPROPION HCL ER (XL) 300 MG PO TB24: ORAL_TABLET | ORAL | 1 refills | Status: DC

## 2022-02-11 MED ORDER — LISINOPRIL 30 MG PO TABS: 30.0000 mg | ORAL_TABLET | Freq: Every day | ORAL | 1 refills | Status: DC

## 2022-02-11 MED ORDER — DULOXETINE HCL 60 MG PO CPEP: 60.0000 mg | ORAL_CAPSULE | Freq: Every day | ORAL | 1 refills | Status: DC

## 2022-02-11 NOTE — Progress Notes (Signed)
Patient ID: Diana Rios, female  DOB: 12-Apr-1955, 67 y.o.   MRN: [id number] Patient Care Team    Relationship Specialty Notifications Start End  Natalia Leatherwood, DO PCP - General Family Medicine  11/20/14   Jeananne Rama, DO  Optometry  03/02/16    Comment: [facility]- [facility]   Chief Complaint  Patient presents with   Hypertension    Subjective: Diana Rios is a 67 y.o.  Female  present for [facility] follow up Weight loss/Obesity with serious comorbid condition Patient reports she was doing well on the [facility] 1 mg weekly   She continues to attempt to exercise and she is watching her diet.  She feels great.  She is no longer in the obesity category. Start weight was 210 lbs>201>189>187>175.6 lbs today.  Depression with anxiety: Patient reports she is feeling well on Wellbutrin 300 mg daily and Cymbalta 60 mg daily.  Pt has suffered with depression and anxiety most of her life.She feels July is always rough for her, she is uncertain why. Her father passed away 20 21 in MAY.   Hypertension/morbid obesity: Pt reports compliance with lisinopril 30 mg QD.  Patient denies chest pain, shortness of breath, dizziness or lower extremity edema.   RF: Hypertension, diabetes, obesity, family history of heart disease  Vitamin D deficiency: She is supplementing daily-2000u     02/11/2022   10:33 AM 01/18/2022    9:07 AM 10/19/2021    9:40 AM 05/12/2021   10:57 AM 11/19/2020    8:31 AM  Depression screen PHQ 2/9  Decreased Interest 0 0 0 0 0  Down, Depressed, Hopeless 0 0 0 0 1  PHQ - 2 Score 0 0 0 0 1  Altered sleeping   0  1  Tired, decreased energy   0  1  Change in appetite   0  1  Feeling bad or failure about yourself    0  1  Trouble concentrating   0  0  Moving slowly or fidgety/restless   0  0  Suicidal thoughts   0  0  PHQ-9 Score   0  5      10/19/2021    9:40 AM 11/19/2020    8:46 AM 01/07/2019    1:32 PM 03/12/2018    1:32 PM  GAD 7 : Generalized Anxiety  Score  Nervous, Anxious, on Edge 0 1 1 0  Control/stop worrying 0 1 1 0  Worry too much - different things 0 1 0 0  Trouble relaxing 0 0 0 0  Restless 0 0 0 0  Easily annoyed or irritable 0 1 0 0  Afraid - awful might happen 0 1 0 0  Total GAD 7 Score 0 5 2 0  Anxiety Difficulty   Not difficult at all Not difficult at all    Immunization History  Administered Date(s) Administered   Fluad Quad(high Dose 65+) 11/19/2020, 10/01/2021   Influenza Split 11/04/2015   Influenza, Quadrivalent, Recombinant, Inj, Pf 10/27/2018   Influenza,inj,Quad PF,6+ Mos 11/19/2016, 11/29/2017   Influenza-Unspecified 10/04/2012, 11/14/2013, 10/19/2014, 11/04/2015, 10/31/2019   PFIZER(Purple Top)SARS-COV-2 Vaccination 03/12/2019, 04/06/2019, 11/05/2019, 07/31/2020   Pneumococcal Conjugate-13 03/02/2016   Pneumococcal Polysaccharide-23 01/10/2020   Tdap 06/01/2011, 10/04/2012   Zoster, Live 11/04/2015    Past Medical History:  Diagnosis Date   Chicken pox    Depression    Diet-controlled diabetes mellitus (HCC)    Hypertension    Allergies  Allergen Reactions   Codeine Itching  Penicillins Itching   Past Surgical History:  Procedure Laterality Date   BREAST BIOPSY Left    Family History  Problem Relation Age of Onset   Heart disease Mother 47       premature   Diabetes Mother    Hypertension Father    Heart disease Sister        heart defect   Diabetes Daughter    Cancer Paternal Grandmother    Social History   Socioeconomic History   Marital status: Married    Spouse name: Not on file   Number of children: 2   Years of education: [employer] colleg   Highest education level: Some college, no degree  Occupational History   Occupation: Control and instrumentation engineer    Comment: Retired  Tobacco Use   Smoking status: Never   Smokeless tobacco: Never  Scientific laboratory technician Use: Never used  Substance and Sexual Activity   Alcohol use: No    Alcohol/week: 0.0 standard drinks of alcohol   Drug use:  No   Sexual activity: Yes    Birth control/protection: Post-menopausal  Other Topics Concern   Not on file  Social History Narrative   Ms Friedlander relocated from [place], [place]. She lived in [place], [place] for short while. 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   Social Determinants of Health   Financial Resource Strain: Low Risk  (02/11/2022)   Overall Financial Resource Strain (CARDIA)    Difficulty of Paying Living Expenses: Not hard at all  Food Insecurity: No Food Insecurity (02/11/2022)   Hunger Vital Sign    Worried About Running Out of Food in the Last Year: Never true    Ran Out of Food in the Last Year: Never true  Transportation Needs: No Transportation Needs (02/11/2022)   PRAPARE - Hydrologist (Medical): No    Lack of Transportation (Non-Medical): No  Physical Activity: Insufficiently Active (02/11/2022)   Exercise Vital Sign    Days of Exercise per Week: 1 day    Minutes of Exercise per Session: 10 min  Stress: No Stress Concern Present (02/11/2022)   Ama    Feeling of Stress : Only a little  Social Connections: Socially Integrated (02/11/2022)   Social Connection and Isolation Panel [NHANES]    Frequency of Communication with Friends and Family: More than three times a week    Frequency of Social Gatherings with Friends and Family: More than three times a week    Attends Religious Services: More than 4 times per year    Active Member of [employer] or Organizations: Yes    Attends Archivist Meetings: More than 4 times per year    Marital Status: Married  Human resources officer Violence: Not At Risk (05/12/2021)   Humiliation, Afraid, Rape, and Kick questionnaire    Fear of Current or Ex-Partner: No    Emotionally Abused: No    Physically Abused: No    Sexually Abused: No   Allergies as of 02/11/2022       Reactions   Codeine Itching   Penicillins  Itching        Medication List        Accurate as of February 11, 2022 10:55 AM. If you have any questions, ask your nurse or doctor.          buPROPion 300 MG 24 hr tablet Commonly known as: WELLBUTRIN XL TAKE 1 TABLET  EVERY DAY   cholecalciferol 25 MCG (1000 UNIT) tablet Commonly known as: VITAMIN D3 Take 1,000 Units by mouth daily.   DULoxetine 60 MG capsule Commonly known as: CYMBALTA Take 1 capsule (60 mg total) by mouth daily.   lisinopril 30 MG tablet Commonly known as: ZESTRIL Take 1 tablet (30 mg total) by mouth daily.   multivitamin capsule Take 1 capsule by mouth daily.   rosuvastatin 20 MG tablet Commonly known as: CRESTOR Take 1 tablet (20 mg total) by mouth daily.   Wegovy 1 MG/0.5ML Soaj Generic drug: Semaglutide-Weight Management Inject 1 mg into the skin once a week.         No results found for this or any previous visit (from the past 2160 hour(s)).     ROS: 14 pt review of systems performed and negative (unless mentioned in an HPI)  Objective: BP 133/74   Pulse 61   Temp 97.9 F (36.6 C)   Wt 175 lb 9.6 oz (79.7 kg)   SpO2 96%   BMI 28.34 kg/m   Physical Exam Vitals and nursing note reviewed.  Constitutional:      General: She is not in acute distress.    Appearance: Normal appearance. She is not ill-appearing, toxic-appearing or diaphoretic.  HENT:     Head: Normocephalic and atraumatic.  Eyes:     General: No scleral icterus.       Right eye: No discharge.        Left eye: No discharge.     Extraocular Movements: Extraocular movements intact.     Conjunctiva/sclera: Conjunctivae normal.     Pupils: Pupils are equal, round, and reactive to light.  Cardiovascular:     Rate and Rhythm: Normal rate and regular rhythm.  Pulmonary:     Effort: Pulmonary effort is normal. No respiratory distress.     Breath sounds: Normal breath sounds. No wheezing, rhonchi or rales.  Musculoskeletal:     Right lower leg: No edema.      Left lower leg: No edema.  Skin:    General: Skin is warm.     Findings: No rash.  Neurological:     Mental Status: She is alert and oriented to person, place, and time. Mental status is at baseline.     Motor: No weakness.     Gait: Gait normal.  Psychiatric:        Mood and Affect: Mood normal.        Behavior: Behavior normal.        Thought Content: Thought content normal.        Judgment: Judgment normal.    No results found for this or any previous visit (from the past 24 hour(s)).   Assessment/plan: KYNSLEE BAHAM is a 67 y.o. female present for  Class 1 drug-induced obesity with serious comorbidity and body mass index (BMI) of 34.0 to 34.9 in adult(elevated A1c, vitamin D deficiency, hyperlipidemia, hypertension, morbid obesity)/Weight loss counseling, encounter for Patient was counseled on exercise, calorie counting. We discussed discontinuing Wegovy.  She is happy with her weight loss.  She is no longer considered obese.  She will finish out her current supply and then discontinue. Body mass index is 28.34 kg/m. 1C to be collected next visit.  Elevated glucose/obesity  - A1c 5.9--> 5.7 --> 5.6 >>5.9 >6.4> >5.6> 6.3> 6.0 >A1c collected next visit Discussed that she is diet controlled and as long as A1c stays below 6.3 she should not need medications.  -Remove diabetes from  her present list> elevated glucose.  She has been well controlled without medications.   - Continue diet and exercise regimen - PNA series: completed  Depression, unspecified depression type Stable Continue Cymbalta  Continue Wellbutrin.    Essential hypertension, benign/obesity Stable Continue lisinopril 30 mg daily.  Labs due next visit Vitamin D deficiency: Vit d collected tday Continue 2000 u daily supplement.     No follow-ups on file.   No orders of the defined types were placed in this encounter.  No orders of the defined types were placed in this encounter.   Referral  Orders  No referral(s) requested today        Electronically signed by: Howard Pouch, [place]

## 2022-02-11 NOTE — Patient Instructions (Addendum)
Return in about 24 weeks (around 07/29/2022) for cpe (20 min), Routine chronic condition follow-up.        Great to see you today.  I have refilled the medication(s) we provide.   If labs were collected, we will inform you of lab results once received either by echart message or telephone call.   - echart message- for normal results that have been seen by the patient already.   - telephone call: abnormal results or if patient has not viewed results in their echart.

## 2022-06-07 ENCOUNTER — Telehealth: Payer: Self-pay | Admitting: Family Medicine

## 2022-06-07 NOTE — Telephone Encounter (Signed)
Contacted Henlee M Threats to schedule their annual wellness visit. Appointment made for 06/29/2022.  Gabriel Cirri [facility] AWV TEAM Direct Dial [phone]

## 2022-07-06 ENCOUNTER — Ambulatory Visit (INDEPENDENT_AMBULATORY_CARE_PROVIDER_SITE_OTHER): Payer: Medicare PPO

## 2022-07-06 VITALS — Wt 175.0 lb

## 2022-07-06 DIAGNOSIS — E2839 Other primary ovarian failure: Secondary | ICD-10-CM

## 2022-07-06 DIAGNOSIS — Z0000 Encounter for general adult medical examination without abnormal findings: Secondary | ICD-10-CM | POA: Diagnosis not present

## 2022-07-06 NOTE — Progress Notes (Signed)
I connected with  Diana Rios on 07/06/22 by a audio enabled telemedicine application and verified that I am speaking with the correct person using two identifiers.  Patient Location: Home  Provider Location: Home Office  I discussed the limitations of evaluation and management by telemedicine. The patient expressed understanding and agreed to proceed.   Subjective:   Diana Rios is a 67 y.o. female who presents for Medicare Annual (Subsequent) preventive examination.  Review of Systems     Cardiac Risk Factors include: advanced age (>46men, >75 women);dyslipidemia;hypertension     Objective:    Today's Vitals   07/06/22 0926  Weight: 175 lb (79.4 kg)   Body mass index is 28.25 kg/m.     07/06/2022    9:30 AM 05/12/2021   10:58 AM  Advanced Directives  Does Patient Have a Medical Advance Directive? No No  Would patient like information on creating a medical advance directive? Yes (MAU/Ambulatory/Procedural Areas - Information given) Yes (MAU/Ambulatory/Procedural Areas - Information given)    Current Medications (verified) Outpatient Encounter Medications as of 07/06/2022  Medication Sig   buPROPion (WELLBUTRIN XL) 300 MG 24 hr tablet TAKE 1 TABLET EVERY DAY   cholecalciferol (VITAMIN D3) 25 MCG (1000 UNIT) tablet Take 1,000 Units by mouth daily.   DULoxetine (CYMBALTA) 60 MG capsule Take 1 capsule (60 mg total) by mouth daily.   lisinopril (ZESTRIL) 30 MG tablet Take 1 tablet (30 mg total) by mouth daily.   Multiple Vitamin (MULTIVITAMIN) capsule Take 1 capsule by mouth daily.   rosuvastatin (CRESTOR) 20 MG tablet Take 1 tablet (20 mg total) by mouth daily.   [DISCONTINUED] WEGOVY 1 MG/0.5ML SOAJ Inject 1 mg into the skin once a week.   No facility-administered encounter medications on file as of 07/06/2022.    Allergies (verified) Codeine and Penicillins   History: Past Medical History:  Diagnosis Date   Chicken pox    Depression    Diet-controlled  diabetes mellitus (HCC)    Hyperpigmented skin lesion 11/19/2020   Hypertension    Past Surgical History:  Procedure Laterality Date   BREAST BIOPSY Left    Family History  Problem Relation Age of Onset   Heart disease Mother 25       premature   Diabetes Mother    Hypertension Father    Heart disease Sister        heart defect   Diabetes Daughter    Cancer Paternal Grandmother    Social History   Socioeconomic History   Marital status: Married    Spouse name: Not on file   Number of children: 2   Years of education: Som colleg   Highest education level: Some college, no degree  Occupational History   Occupation: Geologist, engineering    Comment: Retired  Tobacco Use   Smoking status: Never   Smokeless tobacco: Never  Building services engineer Use: Never used  Substance and Sexual Activity   Alcohol use: No    Alcohol/week: 0.0 standard drinks of alcohol   Drug use: No   Sexual activity: Yes    Birth control/protection: Post-menopausal  Other Topics Concern   Not on file  Social History Narrative   Diana Rios relocated from [place], [place]. She lived in [place], [place] for short while. 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   Social Determinants of Health   Financial Resource Strain: Low Risk  (07/06/2022)   Overall Financial Resource Strain (CARDIA)  Difficulty of Paying Living Expenses: Not hard at all  Food Insecurity: No Food Insecurity (07/06/2022)   Hunger Vital Sign    Worried About Running Out of Food in the Last Year: Never true    Ran Out of Food in the Last Year: Never true  Transportation Needs: No Transportation Needs (07/06/2022)   PRAPARE - Administrator, Civil Service (Medical): No    Lack of Transportation (Non-Medical): No  Physical Activity: Insufficiently Active (07/06/2022)   Exercise Vital Sign    Days of Exercise per Week: 2 days    Minutes of Exercise per Session: 40 min  Stress: No Stress Concern Present (07/06/2022)    [employer] of Occupational Health - Occupational Stress Questionnaire    Feeling of Stress : Not at all  Social Connections: Socially Integrated (07/06/2022)   Social Connection and Isolation Panel [NHANES]    Frequency of Communication with Friends and Family: More than three times a week    Frequency of Social Gatherings with Friends and Family: More than three times a week    Attends Religious Services: More than 4 times per year    Active Member of [employer] or Organizations: Yes    Attends Engineer, structural: More than 4 times per year    Marital Status: Married    Tobacco Counseling Counseling given: Not Answered   Clinical Intake:  Pre-visit preparation completed: Yes  Pain : No/denies pain     BMI - recorded: 28.25 Nutritional Status: BMI 25 -29 Overweight Nutritional Risks: None Diabetes: No  How often do you need to have someone help you when you read instructions, pamphlets, or other written materials from your doctor or pharmacy?: 1 - Never  Diabetic?no  Interpreter Needed?: No  Information entered by :: Lanier Ensign, LPN   Activities of Daily Living    07/06/2022    9:32 AM  In your present state of health, do you have any difficulty performing the following activities:  Hearing? 0  Vision? 0  Difficulty concentrating or making decisions? 0  Walking or climbing stairs? 0  Dressing or bathing? 0  Doing errands, shopping? 0  Preparing Food and eating ? N  Using the Toilet? N  In the past six months, have you accidently leaked urine? N  Do you have problems with loss of bowel control? N  Managing your Medications? N  Managing your Finances? N  Housekeeping or managing your Housekeeping? N    Patient Care Team: Natalia Leatherwood, DO as PCP - General (Family Medicine) Jeananne Rama, DO (Optometry)  Indicate any recent Medical Services you may have received from other than [facility] providers in the past year (date may be approximate).      Assessment:   This is a routine wellness examination for Diana Rios.  Hearing/Vision screen Hearing Screening - Comments:: Pt denies any hearing issues  Vision Screening - Comments:: Pt follows up with my eye Dr for annual eye exams   Dietary issues and exercise activities discussed: Current Exercise Habits: Home exercise routine, Type of exercise: walking, Time (Minutes): 40, Frequency (Times/Week): 2, Weekly Exercise (Minutes/Week): 80   Goals Addressed             This Visit's Progress    Patient Stated       Exercise more       Depression Screen    07/06/2022    9:30 AM 02/11/2022   10:33 AM 01/18/2022    9:07 AM 10/19/2021  9:40 AM 05/12/2021   10:57 AM 11/19/2020    8:31 AM 01/10/2020    9:50 AM  PHQ 2/9 Scores  PHQ - 2 Score 0 0 0 0 0 1 0  PHQ- 9 Score    0  5     Fall Risk    07/06/2022    9:32 AM 02/11/2022    9:46 AM 01/18/2022    9:07 AM 05/12/2021   10:59 AM 11/19/2020    8:31 AM  Fall Risk   Falls in the past year? 0 0 0 0 0  Number falls in past yr: 0  0 0 0  Injury with Fall? 0  0 0 0  Risk for fall due to : Impaired vision  No Fall Risks Impaired vision   Follow up Falls prevention discussed  Falls evaluation completed Falls prevention discussed Falls evaluation completed    FALL RISK PREVENTION PERTAINING TO THE HOME:  Any stairs in or around the home? Yes  If so, are there any without handrails? No  Home free of loose throw rugs in walkways, pet beds, electrical cords, etc? Yes  Adequate lighting in your home to reduce risk of falls? Yes   ASSISTIVE DEVICES UTILIZED TO PREVENT FALLS:  Life alert? No  Use of a cane, walker or w/c? No  Grab bars in the bathroom? Yes  Shower chair or bench in shower? No  Elevated toilet seat or a handicapped toilet? No   TIMED UP AND GO:  Was the test performed? No .  Cognitive Function:        07/06/2022    9:32 AM 05/12/2021   11:00 AM  6CIT Screen  What Year? 0 points 0 points  What month? 0 points  0 points  What time? 0 points 0 points  Count back from 20 0 points 0 points  Months in reverse 0 points 0 points  Repeat phrase 0 points 0 points  Total Score 0 points 0 points    Immunizations Immunization History  Administered Date(s) Administered   Fluad Quad(high Dose 65+) 11/19/2020, 10/01/2021   Influenza Split 11/04/2015   Influenza, Quadrivalent, Recombinant, Inj, Pf 10/27/2018   Influenza,inj,Quad PF,6+ Mos 11/19/2016, 11/29/2017   Influenza-Unspecified 10/04/2012, 11/14/2013, 10/19/2014, 11/04/2015, 10/31/2019   PFIZER(Purple Top)SARS-COV-2 Vaccination 03/12/2019, 04/06/2019, 11/05/2019, 07/31/2020   Pneumococcal Conjugate-13 03/02/2016   Pneumococcal Polysaccharide-23 01/10/2020   Tdap 06/01/2011, 10/04/2012   Zoster, Live 11/04/2015    TDAP status: Up to date  Flu Vaccine status: Up to date  Pneumococcal vaccine status: Up to date  Covid-19 vaccine status: Completed vaccines  Qualifies for Shingles Vaccine? Yes   Zostavax completed No   Shingrix Completed?: No.    Education has been provided regarding the importance of this vaccine. Patient has been advised to call insurance company to determine out of pocket expense if they have not yet received this vaccine. Advised may also receive vaccine at local pharmacy or Health Dept. Verbalized acceptance and understanding.  Screening Tests Health Maintenance  Topic Date Due   Zoster Vaccines- Shingrix (1 of 2) Never done   DEXA SCAN  10/17/2021   INFLUENZA VACCINE  09/01/2022   DTaP/Tdap/Td (3 - Td or Tdap) 10/05/2022   Medicare Annual Wellness (AWV)  07/06/2023   MAMMOGRAM  02/04/2024   Fecal DNA (Cologuard)  11/07/2024   Pneumonia Vaccine 74+ Years old  Completed   HPV VACCINES  Aged Out   Colonoscopy  Discontinued   COVID-19 Vaccine  Discontinued   Hepatitis C  Screening  Discontinued    Health Maintenance  Health Maintenance Due  Topic Date Due   Zoster Vaccines- Shingrix (1 of 2) Never done   DEXA  SCAN  10/17/2021    Colorectal cancer screening: Type of screening: Cologuard. Completed 11/07/21. Repeat every 3 years  Mammogram status: Completed 02/03/22. Repeat every year  Bone Density status: Ordered 07/06/22. Pt provided with contact info and advised to call to schedule appt.  Additional Screening:  Hepatitis C Screening: does not qualify;  Vision Screening: Recommended annual ophthalmology exams for early detection of glaucoma and other disorders of the eye. Is the patient up to date with their annual eye exam?  Yes  Who is the provider or what is the name of the office in which the patient attends annual eye exams? [facility]  If pt is not established with a provider, would they like to be referred to a provider to establish care? No .   Dental Screening: Recommended annual dental exams for proper oral hygiene  Community Resource Referral / Chronic Care Management: CRR required this visit?  No   CCM required this visit?  No      Plan:     I have personally reviewed and noted the following in the patient's chart:   Medical and social history Use of alcohol, tobacco or illicit drugs  Current medications and supplements including opioid prescriptions. Patient is not currently taking opioid prescriptions. Functional ability and status Nutritional status Physical activity Advanced directives List of other physicians Hospitalizations, surgeries, and ER visits in previous 12 months Vitals Screenings to include cognitive, depression, and falls Referrals and appointments  In addition, I have reviewed and discussed with patient certain preventive protocols, quality metrics, and best practice recommendations. A written personalized care plan for preventive services as well as general preventive health recommendations were provided to patient.     [name], LPN   02/05/1094   Nurse Notes: none

## 2022-07-06 NOTE — Patient Instructions (Signed)
[name] , Thank you for taking time to come for your Medicare Wellness Visit. I appreciate your ongoing commitment to your health goals. Please review the following plan we discussed and let me know if I can assist you in the future.   These are the goals we discussed:  Goals      Patient Stated     Lose weight      Patient Stated     Exercise more         This is a list of the screening recommended for you and due dates:  Health Maintenance  Topic Date Due   Zoster (Shingles) Vaccine (1 of 2) Never done   DEXA scan (bone density measurement)  10/17/2021   Flu Shot  09/01/2022   DTaP/Tdap/Td vaccine (3 - Td or Tdap) 10/05/2022   Medicare Annual Wellness Visit  07/06/2023   Mammogram  02/04/2024   Cologuard (Stool DNA test)  11/07/2024   Pneumonia Vaccine  Completed   HPV Vaccine  Aged Out   Colon Cancer Screening  Discontinued   COVID-19 Vaccine  Discontinued   Hepatitis C Screening  Discontinued    Advanced directives: Advance directive discussed with you today. I have provided a copy for you to complete at home and have notarized. Once this is complete please bring a copy in to our office so we can scan it into your chart.  Conditions/risks identified: exercise more   Next appointment: Follow up in one year for your annual wellness visit    Preventive Care 65 Years and Older, Female Preventive care refers to lifestyle choices and visits with your health care provider that can promote health and wellness. What does preventive care include? A yearly physical exam. This is also called an annual well check. Dental exams once or twice a year. Routine eye exams. Ask your health care provider how often you should have your eyes checked. Personal lifestyle choices, including: Daily care of your teeth and gums. Regular physical activity. Eating a healthy diet. Avoiding tobacco and drug use. Limiting alcohol use. Practicing safe sex. Taking low-dose aspirin every  day. Taking vitamin and mineral supplements as recommended by your health care provider. What happens during an annual well check? The services and screenings done by your health care provider during your annual well check will depend on your age, overall health, lifestyle risk factors, and family history of disease. Counseling  Your health care provider may ask you questions about your: Alcohol use. Tobacco use. Drug use. Emotional well-being. Home and relationship well-being. Sexual activity. Eating habits. History of falls. Memory and ability to understand (cognition). Work and work Astronomer. Reproductive health. Screening  You may have the following tests or measurements: Height, weight, and BMI. Blood pressure. Lipid and cholesterol levels. These may be checked every 5 years, or more frequently if you are over 63 years old. Skin check. Lung cancer screening. You may have this screening every year starting at age 68 if you have a 30-pack-year history of smoking and currently smoke or have quit within the past 15 years. Fecal occult blood test (FOBT) of the stool. You may have this test every year starting at age 68 Flexible sigmoidoscopy or colonoscopy. You may have a sigmoidoscopy every 5 years or a colonoscopy every 10 years starting at age 68. Hepatitis C blood test. Hepatitis B blood test. Sexually transmitted disease (STD) testing. Diabetes screening. This is done by checking your blood sugar (glucose) after you have not eaten for a  while (fasting). You may have this done every 1-3 years. Bone density scan. This is done to screen for osteoporosis. You may have this done starting at age 45. Mammogram. This may be done every 1-2 years. Talk to your health care provider about how often you should have regular mammograms. Talk with your health care provider about your test results, treatment options, and if necessary, the need for more tests. Vaccines  Your health care  provider may recommend certain vaccines, such as: Influenza vaccine. This is recommended every year. Tetanus, diphtheria, and acellular pertussis (Tdap, Td) vaccine. You may need a Td booster every 10 years. Zoster vaccine. You may need this after age 52. Pneumococcal 13-valent conjugate (PCV13) vaccine. One dose is recommended after age 19. Pneumococcal polysaccharide (PPSV23) vaccine. One dose is recommended after age 50. Talk to your health care provider about which screenings and vaccines you need and how often you need them. This information is not intended to replace advice given to you by your health care provider. Make sure you discuss any questions you have with your health care provider. Document Released: 02/13/2015 Document Revised: 10/07/2015 Document Reviewed: 11/18/2014 Elsevier Interactive Patient Education  2017 [employer].  Fall Prevention in the Home Falls can cause injuries. They can happen to people of all ages. There are many things you can do to make your home safe and to help prevent falls. What can I do on the outside of my home? Regularly fix the edges of walkways and driveways and fix any cracks. Remove anything that might make you trip as you walk through a door, such as a raised step or threshold. Trim any bushes or trees on the path to your home. Use bright outdoor lighting. Clear any walking paths of anything that might make someone trip, such as rocks or tools. Regularly check to see if handrails are loose or broken. Make sure that both sides of any steps have handrails. Any raised decks and porches should have guardrails on the edges. Have any leaves, snow, or ice cleared regularly. Use sand or salt on walking paths during winter. Clean up any spills in your garage right away. This includes oil or grease spills. What can I do in the bathroom? Use night lights. Install grab bars by the toilet and in the tub and shower. Do not use towel bars as grab  bars. Use non-skid mats or decals in the tub or shower. If you need to sit down in the shower, use a plastic, non-slip stool. Keep the floor dry. Clean up any water that spills on the floor as soon as it happens. Remove soap buildup in the tub or shower regularly. Attach bath mats securely with double-sided non-slip rug tape. Do not have throw rugs and other things on the floor that can make you trip. What can I do in the bedroom? Use night lights. Make sure that you have a light by your bed that is easy to reach. Do not use any sheets or blankets that are too big for your bed. They should not hang down onto the floor. Have a firm chair that has side arms. You can use this for support while you get dressed. Do not have throw rugs and other things on the floor that can make you trip. What can I do in the kitchen? Clean up any spills right away. Avoid walking on wet floors. Keep items that you use a lot in easy-to-reach places. If you need to reach something above you,  use a strong step stool that has a grab bar. Keep electrical cords out of the way. Do not use floor polish or wax that makes floors slippery. If you must use wax, use non-skid floor wax. Do not have throw rugs and other things on the floor that can make you trip. What can I do with my stairs? Do not leave any items on the stairs. Make sure that there are handrails on both sides of the stairs and use them. Fix handrails that are broken or loose. Make sure that handrails are as long as the stairways. Check any carpeting to make sure that it is firmly attached to the stairs. Fix any carpet that is loose or worn. Avoid having throw rugs at the top or bottom of the stairs. If you do have throw rugs, attach them to the floor with carpet tape. Make sure that you have a light switch at the top of the stairs and the bottom of the stairs. If you do not have them, ask someone to add them for you. What else can I do to help prevent  falls? Wear shoes that: Do not have high heels. Have rubber bottoms. Are comfortable and fit you well. Are closed at the toe. Do not wear sandals. If you use a stepladder: Make sure that it is fully opened. Do not climb a closed stepladder. Make sure that both sides of the stepladder are locked into place. Ask someone to hold it for you, if possible. Clearly mark and make sure that you can see: Any grab bars or handrails. First and last steps. Where the edge of each step is. Use tools that help you move around (mobility aids) if they are needed. These include: Canes. Walkers. Scooters. Crutches. Turn on the lights when you go into a dark area. Replace any light bulbs as soon as they burn out. Set up your furniture so you have a clear path. Avoid moving your furniture around. If any of your floors are uneven, fix them. If there are any pets around you, be aware of where they are. Review your medicines with your doctor. Some medicines can make you feel dizzy. This can increase your chance of falling. Ask your doctor what other things that you can do to help prevent falls. This information is not intended to replace advice given to you by your health care provider. Make sure you discuss any questions you have with your health care provider. Document Released: 11/13/2008 Document Revised: 06/25/2015 Document Reviewed: 02/21/2014 Elsevier Interactive Patient Education  2017 [employer].

## 2022-07-16 ENCOUNTER — Other Ambulatory Visit: Payer: Self-pay | Admitting: Family Medicine

## 2022-08-03 ENCOUNTER — Encounter: Payer: Medicare PPO | Admitting: Family Medicine

## 2022-08-31 ENCOUNTER — Encounter: Payer: Self-pay | Admitting: Family Medicine

## 2022-08-31 ENCOUNTER — Ambulatory Visit (INDEPENDENT_AMBULATORY_CARE_PROVIDER_SITE_OTHER): Payer: Medicare PPO | Admitting: Family Medicine

## 2022-08-31 VITALS — BP 120/73 | HR 68 | Temp 97.8°F | Ht 66.0 in | Wt 180.2 lb

## 2022-08-31 DIAGNOSIS — E785 Hyperlipidemia, unspecified: Secondary | ICD-10-CM | POA: Diagnosis not present

## 2022-08-31 DIAGNOSIS — Z1231 Encounter for screening mammogram for malignant neoplasm of breast: Secondary | ICD-10-CM

## 2022-08-31 DIAGNOSIS — I10 Essential (primary) hypertension: Secondary | ICD-10-CM | POA: Diagnosis not present

## 2022-08-31 DIAGNOSIS — F418 Other specified anxiety disorders: Secondary | ICD-10-CM | POA: Diagnosis not present

## 2022-08-31 DIAGNOSIS — R7309 Other abnormal glucose: Secondary | ICD-10-CM

## 2022-08-31 DIAGNOSIS — E559 Vitamin D deficiency, unspecified: Secondary | ICD-10-CM

## 2022-08-31 DIAGNOSIS — Z0000 Encounter for general adult medical examination without abnormal findings: Secondary | ICD-10-CM

## 2022-08-31 DIAGNOSIS — Z23 Encounter for immunization: Secondary | ICD-10-CM

## 2022-08-31 LAB — TSH: TSH: 2.77 u[IU]/mL (ref 0.35–5.50)

## 2022-08-31 LAB — COMPREHENSIVE METABOLIC PANEL
ALT: 21 U/L (ref 0–35)
AST: 22 U/L (ref 0–37)
Albumin: 4.1 g/dL (ref 3.5–5.2)
Alkaline Phosphatase: 93 U/L (ref 39–117)
BUN: 21 mg/dL (ref 6–23)
CO2: 26 mEq/L (ref 19–32)
Calcium: 9.4 mg/dL (ref 8.4–10.5)
Chloride: 108 mEq/L (ref 96–112)
Creatinine, Ser: 1.05 mg/dL (ref 0.40–1.20)
GFR: 54.61 mL/min — ABNORMAL LOW (ref >60.00)
Glucose, Bld: 121 mg/dL — ABNORMAL HIGH (ref 70–99)
Potassium: 3.9 mEq/L (ref 3.5–5.1)
Sodium: 140 mEq/L (ref 135–145)
Total Bilirubin: 0.6 mg/dL (ref 0.2–1.2)
Total Protein: 6.6 g/dL (ref 6.0–8.3)

## 2022-08-31 LAB — CBC WITH DIFFERENTIAL/PLATELET
Basophils Absolute: 0 10*3/uL (ref 0.0–0.1)
Basophils Relative: 0.6 % (ref 0.0–3.0)
Eosinophils Absolute: 0.1 10*3/uL (ref 0.0–0.7)
Eosinophils Relative: 3.7 % (ref 0.0–5.0)
HCT: 40.2 % (ref 36.0–46.0)
Hemoglobin: 12.7 g/dL (ref 12.0–15.0)
Lymphocytes Relative: 33 % (ref 12.0–46.0)
Lymphs Abs: 1.3 10*3/uL (ref 0.7–4.0)
MCHC: 31.6 g/dL (ref 30.0–36.0)
MCV: 85.3 fl (ref 78.0–100.0)
Monocytes Absolute: 0.2 10*3/uL (ref 0.1–1.0)
Monocytes Relative: 6.3 % (ref 3.0–12.0)
Neutro Abs: 2.2 10*3/uL (ref 1.4–7.7)
Neutrophils Relative %: 56.4 % (ref 43.0–77.0)
Platelets: 260 10*3/uL (ref 150.0–400.0)
RBC: 4.72 Mil/uL (ref 3.87–5.11)
RDW: 15.2 % (ref 11.5–15.5)
WBC: 3.8 10*3/uL — ABNORMAL LOW (ref 4.0–10.5)

## 2022-08-31 LAB — LIPID PANEL
Cholesterol: 153 mg/dL (ref 0–200)
HDL: 73.3 mg/dL (ref >39.00)
LDL Cholesterol: 64 mg/dL (ref 0–99)
NonHDL: 79.22
Total CHOL/HDL Ratio: 2
Triglycerides: 77 mg/dL (ref 0.0–149.0)
VLDL: 15.4 mg/dL (ref 0.0–40.0)

## 2022-08-31 LAB — VITAMIN D 25 HYDROXY (VIT D DEFICIENCY, FRACTURES): VITD: 25.33 ng/mL — ABNORMAL LOW (ref 30.00–100.00)

## 2022-08-31 LAB — HEMOGLOBIN A1C: Hgb A1c MFr Bld: 6 % (ref 4.6–6.5)

## 2022-08-31 MED ORDER — DULOXETINE HCL 60 MG PO CPEP: 60.0000 mg | ORAL_CAPSULE | Freq: Every day | ORAL | 1 refills | Status: DC

## 2022-08-31 MED ORDER — BUPROPION HCL ER (XL) 300 MG PO TB24: ORAL_TABLET | ORAL | 1 refills | Status: DC

## 2022-08-31 MED ORDER — LISINOPRIL 30 MG PO TABS: 30.0000 mg | ORAL_TABLET | Freq: Every day | ORAL | 1 refills | Status: DC

## 2022-08-31 MED ORDER — TETANUS-DIPHTH-ACELL PERTUSSIS 5-2.5-18.5 LF-MCG/0.5 IM SUSP: 0.5000 mL | Freq: Once | INTRAMUSCULAR | 0 refills | Status: AC

## 2022-08-31 MED ORDER — ROSUVASTATIN CALCIUM 20 MG PO TABS: 20.0000 mg | ORAL_TABLET | Freq: Every day | ORAL | 3 refills | Status: DC

## 2022-08-31 MED ORDER — ZOSTER VAC RECOMB ADJUVANTED 50 MCG/0.5ML IM SUSR
0.5000 mL | Freq: Once | INTRAMUSCULAR | 1 refills | Status: AC
Start: 2022-08-31 — End: 2022-08-31

## 2022-08-31 NOTE — Progress Notes (Signed)
Patient ID: Diana Rios, female  DOB: [birth year], 67 y.o.   MRN: [id number] Patient Care Team    Relationship Specialty Notifications Start End  Diana Leatherwood, DO PCP - General Family Medicine  11/20/14   Diana Rama, DO  Optometry  03/02/16    Comment: [facility]- Diana Rios   Chief Complaint  Patient presents with   Annual Exam    Meds not taken this morning; pt is fasting    Subjective: Diana Rios is a 67 y.o.  Female  present for annual preventive exam and Chronic Conditions/illness Management Health maintenance:  Colon cancer screen: Cologuard completed 11/07/2021-negative, repeat 3 years Mammogram: Completed 02/03/2022-[facility].  Ordered for 2025 Immunizations: Tdap-printed today, influenza encouraged yearly during season, pneumonia 20 completed, Shingrix-printed Infectious disease screening: Declined  Depression with anxiety: Patient reports she feels well on Wellbutrin 300 mg daily and Cymbalta 60 mg daily.  Pt has suffered with depression and anxiety most of her life.She feels July is always rough for her, she is uncertain why.   Hypertension/morbid obesity: Pt reports compliance with lisinopril 30 mg QD.  Patient denies chest pain, shortness of breath, dizziness or lower extremity edema.   RF: Hypertension, diabetes, obesity, family history of heart disease  Vitamin D deficiency: She is supplementing daily-2000u     08/31/2022    7:56 AM 07/06/2022    9:30 AM 02/11/2022   10:33 AM 01/18/2022    9:07 AM 10/19/2021    9:40 AM  Depression screen PHQ 2/9  Decreased Interest 0 0 0 0 0  Down, Depressed, Hopeless 0 0 0 0 0  PHQ - 2 Score 0 0 0 0 0  Altered sleeping 0    0  Tired, decreased energy 0    0  Change in appetite 1    0  Feeling bad or failure about yourself  1    0  Trouble concentrating 0    0  Moving slowly or fidgety/restless 0    0  Suicidal thoughts 0    0  PHQ-9 Score 2    0      10/19/2021    9:40 AM 11/19/2020    8:46 AM 01/07/2019     1:32 PM 03/12/2018    1:32 PM  GAD 7 : Generalized Anxiety Score  Nervous, Anxious, on Edge 0 1 1 0  Control/stop worrying 0 1 1 0  Worry too much - different things 0 1 0 0  Trouble relaxing 0 0 0 0  Restless 0 0 0 0  Easily annoyed or irritable 0 1 0 0  Afraid - awful might happen 0 1 0 0  Total GAD 7 Score 0 5 2 0  Anxiety Difficulty   Not difficult at all Not difficult at all    Immunization History  Administered Date(s) Administered   Fluad Quad(high Dose 65+) 11/19/2020, 10/01/2021   Influenza Split 11/04/2015   Influenza, Quadrivalent, Recombinant, Inj, Pf 10/27/2018   Influenza,inj,Quad PF,6+ Mos 11/19/2016, 11/29/2017   Influenza-Unspecified 10/04/2012, 11/14/2013, 10/19/2014, 11/04/2015, 10/31/2019   PFIZER(Purple Top)SARS-COV-2 Vaccination 03/12/2019, 04/06/2019, 11/05/2019, 07/31/2020   Pneumococcal Conjugate-13 03/02/2016   Pneumococcal Polysaccharide-23 01/10/2020   Tdap 06/01/2011, 10/04/2012   Zoster, Live 11/04/2015    Past Medical History:  Diagnosis Date   Chicken pox    Depression    Diet-controlled diabetes mellitus (HCC)    Hyperpigmented skin lesion 11/19/2020   Hypertension    Allergies  Allergen Reactions   Codeine Itching   Penicillins  Itching   Past Surgical History:  Procedure Laterality Date   BREAST BIOPSY Left    Family History  Problem Relation Age of Onset   Heart disease Mother 39       premature   Diabetes Mother    Hypertension Father    Heart disease Sister        heart defect   Diabetes Daughter    Cancer Paternal Grandmother    Social History   Socioeconomic History   Marital status: Married    Spouse name: Not on file   Number of children: 2   Years of education: [employer]   Highest education level: Some college, no degree  Occupational History   Occupation: Geologist, engineering    Comment: Retired  Tobacco Use   Smoking status: Never   Smokeless tobacco: Never  Advertising account planner   Vaping status: Never Used   Substance and Sexual Activity   Alcohol use: No    Alcohol/week: 0.0 standard drinks of alcohol   Drug use: No   Sexual activity: Yes    Birth control/protection: Post-menopausal  Other Topics Concern   Not on file  Social History Narrative   Diana Rios relocated from [place], [place]. She lived in [place], [place] for short while. 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   Social Determinants of Health   Financial Resource Strain: Low Risk  (07/06/2022)   Overall Financial Resource Strain (CARDIA)    Difficulty of Paying Living Expenses: Not hard at all  Food Insecurity: No Food Insecurity (07/06/2022)   Hunger Vital Sign    Worried About Running Out of Food in the Last Year: Never true    Ran Out of Food in the Last Year: Never true  Transportation Needs: No Transportation Needs (07/06/2022)   PRAPARE - Administrator, Civil Service (Medical): No    Lack of Transportation (Non-Medical): No  Physical Activity: Insufficiently Active (07/06/2022)   Exercise Vital Sign    Days of Exercise per Week: 2 days    Minutes of Exercise per Session: 40 min  Stress: No Stress Concern Present (07/06/2022)   [employer] of Occupational Health - Occupational Stress Questionnaire    Feeling of Stress : Not at all  Social Connections: Socially Integrated (07/06/2022)   Social Connection and Isolation Panel [NHANES]    Frequency of Communication with Friends and Family: More than three times a week    Frequency of Social Gatherings with Friends and Family: More than three times a week    Attends Religious Services: More than 4 times per year    Active Member of [employer] or Organizations: Yes    Attends Banker Meetings: More than 4 times per year    Marital Status: Married  Catering manager Violence: Not At Risk (07/06/2022)   Humiliation, Afraid, Rape, and Kick questionnaire    Fear of Current or Ex-Partner: No    Emotionally Abused: No    Physically Abused: No     Sexually Abused: No   Allergies as of 08/31/2022       Reactions   Codeine Itching   Penicillins Itching        Medication List        Accurate as of August 31, 2022  8:17 AM. If you have any questions, ask your nurse or doctor.          buPROPion 300 MG 24 hr tablet Commonly known as: WELLBUTRIN XL TAKE 1 TABLET  EVERY DAY What changed:  how much to take how to take this when to take this additional instructions Changed by: Felix Pacini   cholecalciferol 25 MCG (1000 UNIT) tablet Commonly known as: VITAMIN D3 Take 1,000 Units by mouth daily.   DULoxetine 60 MG capsule Commonly known as: CYMBALTA Take 1 capsule (60 mg total) by mouth daily.   lisinopril 30 MG tablet Commonly known as: ZESTRIL Take 1 tablet (30 mg total) by mouth daily.   multivitamin capsule Take 1 capsule by mouth daily.   rosuvastatin 20 MG tablet Commonly known as: CRESTOR Take 1 tablet (20 mg total) by mouth daily.   Tdap 5-2.5-18.5 LF-MCG/0.5 injection Commonly known as: BOOSTRIX Inject 0.5 mLs into the muscle once for 1 dose. Started by: Felix Pacini   Zoster Vaccine Adjuvanted injection Commonly known as: SHINGRIX Inject 0.5 mLs into the muscle once for 1 dose. Repeat  injection once between 2-6 months after initial injection, for a series of 2. Started by: Felix Pacini         No results found for this or any previous visit (from the past 2160 hour(s)).     ROS: 14 pt review of systems performed and negative (unless mentioned in an HPI)  Objective: BP 120/73   Pulse 68   Temp 97.8 F (36.6 C)   Ht 5\' 6"  (1.676 m)   Wt 180 lb 3.2 oz (81.7 kg)   SpO2 96%   BMI 29.09 kg/m   Physical Exam Vitals and nursing note reviewed.  Constitutional:      General: She is not in acute distress.    Appearance: Normal appearance. She is not ill-appearing or toxic-appearing.  HENT:     Head: Normocephalic and atraumatic.     Right Ear: Tympanic membrane, ear canal and external  ear normal. There is no impacted cerumen.     Left Ear: Tympanic membrane, ear canal and external ear normal. There is no impacted cerumen.     Nose: No congestion or rhinorrhea.     Mouth/Throat:     Mouth: Mucous membranes are moist.     Pharynx: Oropharynx is clear. No oropharyngeal exudate or posterior oropharyngeal erythema.  Eyes:     General: No scleral icterus.       Right eye: No discharge.        Left eye: No discharge.     Extraocular Movements: Extraocular movements intact.     Conjunctiva/sclera: Conjunctivae normal.     Pupils: Pupils are equal, round, and reactive to light.  Cardiovascular:     Rate and Rhythm: Normal rate and regular rhythm.     Pulses: Normal pulses.     Heart sounds: Normal heart sounds. No murmur heard.    No friction rub. No gallop.  Pulmonary:     Effort: Pulmonary effort is normal. No respiratory distress.     Breath sounds: Normal breath sounds. No stridor. No wheezing, rhonchi or rales.  Chest:     Chest wall: No tenderness.  Abdominal:     General: Abdomen is flat. Bowel sounds are normal. There is no distension.     Palpations: Abdomen is soft. There is no mass.     Tenderness: There is no abdominal tenderness. There is no right CVA tenderness, left CVA tenderness, guarding or rebound.     Hernia: No hernia is present.  Musculoskeletal:        General: No swelling, tenderness or deformity. Normal range of motion.     Cervical  back: Normal range of motion and neck supple. No rigidity or tenderness.     Right lower leg: No edema.     Left lower leg: No edema.  Lymphadenopathy:     Cervical: No cervical adenopathy.  Skin:    General: Skin is warm and dry.     Coloration: Skin is not jaundiced or pale.     Findings: No bruising, erythema, lesion or rash.  Neurological:     General: No focal deficit present.     Mental Status: She is alert and oriented to person, place, and time. Mental status is at baseline.     Cranial Nerves: No  cranial nerve deficit.     Sensory: No sensory deficit.     Motor: No weakness.     Coordination: Coordination normal.     Gait: Gait normal.     Deep Tendon Reflexes: Reflexes normal.  Psychiatric:        Mood and Affect: Mood normal.        Behavior: Behavior normal.        Thought Content: Thought content normal.        Judgment: Judgment normal.    No results found for this or any previous visit (from the past 24 hour(s)).   Assessment/plan: Diana Rios is a 67 y.o. female present for annual physical exam and chronic condition management appointment together Elevated A1c - A1c 5.9--> 5.7 --> 5.6 >>5.9 >6.4> >5.6> 6.3> 6.0 >A1c collected today  Discussed that she is diet controlled and as long as A1c stays below 6.3 she should not need medications.  -Remove diabetes from her present list> elevated glucose.  She has been well controlled without medications.   - Continue diet and exercise regimen - PNA series: completed  Depression, unspecified depression type Stable Continue Cymbalta 60 mg daily Continue Wellbutrin.  300 mg daily  Essential hypertension, benign/obesity Stable Continue isinopril 30 mg daily.  CBC, CMP, TSH and lipids collected today  Vitamin D deficiency: Vit d collected today Continue 2000 u daily supplement.   Routine general medical examination at a health care facility Colon cancer screen: Cologuard completed 11/07/2021-negative, repeat 3 years Mammogram: Completed 02/03/2022-[facility].  Ordered for 2025 Immunizations: Tdap-printed today, influenza encouraged yearly during season, pneumonia 20 completed, Shingrix-printed Infectious disease screening: Declined Patient was encouraged to exercise greater than 150 minutes a week. Patient was encouraged to choose a diet filled with fresh fruits and vegetables, and lean meats. AVS provided to patient today for education/recommendation on gender specific health and safety maintenance.  Breast cancer  screening by mammogram - MM 3D SCREENING MAMMOGRAM BILATERAL BREAST; Future  Need vaccination Printed script for tdap and shingrix for her  Return in about 24 weeks (around 02/15/2023) for Routine chronic condition follow-up.   Orders Placed This Encounter  Procedures   MM 3D SCREENING MAMMOGRAM BILATERAL BREAST   CBC with Differential/Platelet   Comprehensive metabolic panel   Hemoglobin A1c   TSH   Lipid panel   Vitamin D (25 hydroxy)   Meds ordered this encounter  Medications   buPROPion (WELLBUTRIN XL) 300 MG 24 hr tablet    Sig: TAKE 1 TABLET EVERY DAY    Dispense:  90 tablet    Refill:  1   DULoxetine (CYMBALTA) 60 MG capsule    Sig: Take 1 capsule (60 mg total) by mouth daily.    Dispense:  90 capsule    Refill:  1   lisinopril (ZESTRIL) 30 MG tablet  Sig: Take 1 tablet (30 mg total) by mouth daily.    Dispense:  90 tablet    Refill:  1   rosuvastatin (CRESTOR) 20 MG tablet    Sig: Take 1 tablet (20 mg total) by mouth daily.    Dispense:  90 tablet    Refill:  3   Tdap (BOOSTRIX) 5-2.5-18.5 LF-MCG/0.5 injection    Sig: Inject 0.5 mLs into the muscle once for 1 dose.    Dispense:  0.5 mL    Refill:  0   Zoster Vaccine Adjuvanted [facility]) injection    Sig: Inject 0.5 mLs into the muscle once for 1 dose. Repeat  injection once between 2-6 months after initial injection, for a series of 2.    Dispense:  1 each    Refill:  1    Referral Orders  No referral(s) requested today      Electronically signed by: Felix Pacini, DO [facility] [place]

## 2022-08-31 NOTE — Patient Instructions (Addendum)

## 2023-01-17 ENCOUNTER — Other Ambulatory Visit: Payer: Self-pay | Admitting: Family Medicine

## 2023-01-17 DIAGNOSIS — E2839 Other primary ovarian failure: Secondary | ICD-10-CM

## 2023-01-17 DIAGNOSIS — Z1231 Encounter for screening mammogram for malignant neoplasm of breast: Secondary | ICD-10-CM

## 2023-02-10 ENCOUNTER — Ambulatory Visit
Admission: RE | Admit: 2023-02-10 | Discharge: 2023-02-10 | Disposition: A | Discharge status: Home / Self Care | Payer: Medicare PPO | Source: Ambulatory Visit | Attending: Family Medicine | Admitting: Family Medicine

## 2023-02-10 DIAGNOSIS — Z1231 Encounter for screening mammogram for malignant neoplasm of breast: Secondary | ICD-10-CM | POA: Diagnosis not present

## 2023-02-15 ENCOUNTER — Encounter: Payer: Self-pay | Admitting: Family Medicine

## 2023-02-15 ENCOUNTER — Ambulatory Visit: Payer: Medicare PPO | Admitting: Family Medicine

## 2023-02-15 VITALS — BP 138/82 | HR 77 | Temp 97.7°F | Wt 201.2 lb

## 2023-02-15 DIAGNOSIS — E785 Hyperlipidemia, unspecified: Secondary | ICD-10-CM

## 2023-02-15 DIAGNOSIS — F418 Other specified anxiety disorders: Secondary | ICD-10-CM

## 2023-02-15 DIAGNOSIS — E559 Vitamin D deficiency, unspecified: Secondary | ICD-10-CM | POA: Diagnosis not present

## 2023-02-15 DIAGNOSIS — I10 Essential (primary) hypertension: Secondary | ICD-10-CM

## 2023-02-15 DIAGNOSIS — R7309 Other abnormal glucose: Secondary | ICD-10-CM | POA: Diagnosis not present

## 2023-02-15 MED ORDER — DULOXETINE HCL 60 MG PO CPEP: 60.0000 mg | ORAL_CAPSULE | Freq: Every day | ORAL | 2 refills | Status: DC

## 2023-02-15 MED ORDER — LISINOPRIL 30 MG PO TABS: 30.0000 mg | ORAL_TABLET | Freq: Every day | ORAL | 2 refills | Status: DC

## 2023-02-15 MED ORDER — BUPROPION HCL ER (XL) 300 MG PO TB24: ORAL_TABLET | ORAL | 2 refills | Status: DC

## 2023-02-15 MED ORDER — ROSUVASTATIN CALCIUM 20 MG PO TABS: 20.0000 mg | ORAL_TABLET | Freq: Every day | ORAL | 3 refills | Status: DC

## 2023-02-15 NOTE — Progress Notes (Signed)
 Patient ID: Diana Rios, female  DOB: Dec 07, 1955, 68 y.o.   MRN: [id number] Patient Care Team    Relationship Specialty Notifications Start End  Mariel Shope, DO PCP - General Family Medicine  11/20/14   Germaine Kohut, DO  Optometry  03/02/16    Comment: [facility]   Chief Complaint  Patient presents with   Depression    Subjective: Diana Rios is a 68 y.o.  Female  present for Chronic Conditions/illness Management  Depression with anxiety: Patient reports compliance with Wellbutrin  300 mg daily and Cymbalta  60 mg daily.  She feels the medication is working well for her.  Pt has suffered with depression and anxiety most of her life.She feels July is always rough for her, she is uncertain why.   Hypertension/morbid obesity: Pt reports compliance with lisinopril  30 mg QD.  Patient denies chest pain, shortness of breath, dizziness or lower extremity edema.  RF: Hypertension, diabetes, obesity, family history of heart disease  Vitamin D  deficiency: She is supplementing daily-2000u     08/31/2022    7:56 AM 07/06/2022    9:30 AM 02/11/2022   10:33 AM 01/18/2022    9:07 AM 10/19/2021    9:40 AM  Depression screen PHQ 2/9  Decreased Interest 0 0 0 0 0  Down, Depressed, Hopeless 0 0 0 0 0  PHQ - 2 Score 0 0 0 0 0  Altered sleeping 0    0  Tired, decreased energy 0    0  Change in appetite 1    0  Feeling bad or failure about yourself  1    0  Trouble concentrating 0    0  Moving slowly or fidgety/restless 0    0  Suicidal thoughts 0    0  PHQ-9 Score 2    0      10/19/2021    9:40 AM 11/19/2020    8:46 AM 01/07/2019    1:32 PM 03/12/2018    1:32 PM  GAD 7 : Generalized Anxiety Score  Nervous, Anxious, on Edge 0 1 1 0  Control/stop worrying 0 1 1 0  Worry too much - different things 0 1 0 0  Trouble relaxing 0 0 0 0  Restless 0 0 0 0  Easily annoyed or irritable 0 1 0 0  Afraid - awful might happen 0 1 0 0  Total GAD 7 Score 0 5 2 0  Anxiety  Difficulty   Not difficult at all Not difficult at all    Immunization History  Administered Date(s) Administered   Fluad Quad(high Dose 65+) 11/19/2020, 10/01/2021   Influenza Split 11/04/2015   Influenza, Quadrivalent, Recombinant, Inj, Pf 10/27/2018   Influenza,inj,Quad PF,6+ Mos 11/19/2016, 11/29/2017   Influenza-Unspecified 10/04/2012, 11/14/2013, 10/19/2014, 11/04/2015, 10/31/2019, 10/11/2022   PFIZER(Purple Top)SARS-COV-2 Vaccination 03/12/2019, 04/06/2019, 11/05/2019, 07/31/2020   Pneumococcal Conjugate-13 03/02/2016   Pneumococcal Polysaccharide-23 01/10/2020   Tdap 06/01/2011, 10/04/2012, 10/26/2022   Zoster Recombinant(Shingrix) 10/01/2022, 01/17/2023   Zoster, Live 11/04/2015    Past Medical History:  Diagnosis Date   Chicken pox    Depression    Diet-controlled diabetes mellitus (HCC)    Hyperpigmented skin lesion 11/19/2020   Hypertension    Allergies  Allergen Reactions   Codeine Itching   Penicillins Itching   Past Surgical History:  Procedure Laterality Date   BREAST BIOPSY Left    Family History  Problem Relation Age of Onset   Heart disease Mother 76  premature   Diabetes Mother    Hypertension Father    Heart disease Sister        heart defect   Diabetes Daughter    Cancer Paternal Grandmother    Breast cancer Neg Hx    Social History   Socioeconomic History   Marital status: Married    Spouse name: Not on file   Number of children: 2   Years of education: [employer]   Highest education level: Some college, no degree  Occupational History   Occupation: Geologist, engineering    Comment: Retired  Tobacco Use   Smoking status: Never   Smokeless tobacco: Never  Advertising account planner   Vaping status: Never Used  Substance and Sexual Activity   Alcohol use: No    Alcohol/week: 0.0 standard drinks of alcohol   Drug use: No   Sexual activity: Yes    Birth control/protection: Post-menopausal  Other Topics Concern   Not on file  Social History  Narrative   Ms Rostek relocated from [place], [place]. She lived in [place], [place] for short while. 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   Social Drivers of Corporate investment banker Strain: Low Risk  (07/06/2022)   Overall Financial Resource Strain (CARDIA)    Difficulty of Paying Living Expenses: Not hard at all  Food Insecurity: No Food Insecurity (07/06/2022)   Hunger Vital Sign    Worried About Running Out of Food in the Last Year: Never true    Ran Out of Food in the Last Year: Never true  Transportation Needs: No Transportation Needs (07/06/2022)   PRAPARE - Administrator, Civil Service (Medical): No    Lack of Transportation (Non-Medical): No  Physical Activity: Insufficiently Active (07/06/2022)   Exercise Vital Sign    Days of Exercise per Week: 2 days    Minutes of Exercise per Session: 40 min  Stress: No Stress Concern Present (07/06/2022)   [employer] of Occupational Health - Occupational Stress Questionnaire    Feeling of Stress : Not at all  Social Connections: Socially Integrated (07/06/2022)   Social Connection and Isolation Panel [NHANES]    Frequency of Communication with Friends and Family: More than three times a week    Frequency of Social Gatherings with Friends and Family: More than three times a week    Attends Religious Services: More than 4 times per year    Active Member of [employer] or Organizations: Not on file    Attends Banker Meetings: More than 4 times per year    Marital Status: Married  Catering manager Violence: Not At Risk (07/06/2022)   Humiliation, Afraid, Rape, and Kick questionnaire    Fear of Current or Ex-Partner: No    Emotionally Abused: No    Physically Abused: No    Sexually Abused: No   Allergies as of 02/15/2023       Reactions   Codeine Itching   Penicillins Itching        Medication List        Accurate as of February 15, 2023  9:04 AM. If you have any questions, ask your nurse or  doctor.          STOP taking these medications    Boostrix 5-2.5-18.5 LF-MCG/0.5 injection Generic drug: Tdap Stopped by: Napolean Backbone   Fluzone High-Dose 0.5 ML injection Generic drug: Influenza vac split quadrivalent PF Stopped by: Napolean Backbone   Shingrix injection Generic drug: Zoster  Vaccine Adjuvanted Stopped by: Napolean Backbone       TAKE these medications    buPROPion  300 MG 24 hr tablet Commonly known as: WELLBUTRIN  XL TAKE 1 TABLET EVERY DAY   cholecalciferol 25 MCG (1000 UNIT) tablet Commonly known as: VITAMIN D3 Take 2,000 Units by mouth daily.   DULoxetine  60 MG capsule Commonly known as: CYMBALTA  Take 1 capsule (60 mg total) by mouth daily.   lisinopril  30 MG tablet Commonly known as: ZESTRIL  Take 1 tablet (30 mg total) by mouth daily.   multivitamin capsule Take 1 capsule by mouth daily.   rosuvastatin  20 MG tablet Commonly known as: CRESTOR  Take 1 tablet (20 mg total) by mouth daily.         No results found for this or any previous visit (from the past 2160 hours).     ROS: 14 pt review of systems performed and negative (unless mentioned in an HPI)  Objective: BP 138/82   Pulse 77   Temp 97.7 F (36.5 C)   Wt 201 lb 3.2 oz (91.3 kg)   SpO2 95%   BMI 32.47 kg/m   Physical Exam Vitals and nursing note reviewed.  Constitutional:      General: She is not in acute distress.    Appearance: Normal appearance. She is not ill-appearing, toxic-appearing or diaphoretic.  HENT:     Head: Normocephalic and atraumatic.  Eyes:     General: No scleral icterus.       Right eye: No discharge.        Left eye: No discharge.     Extraocular Movements: Extraocular movements intact.     Conjunctiva/sclera: Conjunctivae normal.     Pupils: Pupils are equal, round, and reactive to light.  Cardiovascular:     Rate and Rhythm: Normal rate and regular rhythm.  Pulmonary:     Effort: Pulmonary effort is normal. No respiratory distress.      Breath sounds: Normal breath sounds. No wheezing, rhonchi or rales.  Musculoskeletal:     Right lower leg: No edema.     Left lower leg: No edema.  Skin:    General: Skin is warm.     Findings: No rash.  Neurological:     Mental Status: She is alert and oriented to person, place, and time. Mental status is at baseline.     Motor: No weakness.     Gait: Gait normal.  Psychiatric:        Mood and Affect: Mood normal.        Behavior: Behavior normal.        Thought Content: Thought content normal.        Judgment: Judgment normal.    No results found for this or any previous visit (from the past 24 hours).   Assessment/plan: ABHIGNA HOOLE is a 68 y.o. female present for chronic condition management appointment together Elevated A1c - A1c 5.9--> 5.7 --> 5.6 >>5.9 >6.4> >5.6> 6.3> 6.0 >yearly A1c now Discussed that she is diet controlled and as long as A1c stays below 6.3 she should not need medications.  -Remove diabetes from her present list> elevated glucose.  She has been well controlled without medications.   - Continue diet and exercise regimen - PNA series: completed  Depression, unspecified depression type Stable continue Cymbalta  60 mg daily Continue Wellbutrin .  300 mg daily  Essential hypertension, benign/obesity Stable Continue lisinopril  30 mg daily.  Labs UTD , due next visit  Vitamin D  deficiency: Vit d  utd -due 08/2022 Continue 2000 u daily supplement.   Return in about 7 months (around 09/01/2023) for cpe (20 min), Routine chronic condition follow-up.   No orders of the defined types were placed in this encounter.  Meds ordered this encounter  Medications   buPROPion  (WELLBUTRIN  XL) 300 MG 24 hr tablet    Sig: TAKE 1 TABLET EVERY DAY    Dispense:  90 tablet    Refill:  2   DULoxetine  (CYMBALTA ) 60 MG capsule    Sig: Take 1 capsule (60 mg total) by mouth daily.    Dispense:  90 capsule    Refill:  2   lisinopril  (ZESTRIL ) 30 MG tablet    Sig:  Take 1 tablet (30 mg total) by mouth daily.    Dispense:  90 tablet    Refill:  2   rosuvastatin  (CRESTOR ) 20 MG tablet    Sig: Take 1 tablet (20 mg total) by mouth daily.    Dispense:  90 tablet    Refill:  3    Referral Orders  No referral(s) requested today    Electronically signed by: Napolean Backbone, DO [facility]

## 2023-02-15 NOTE — Patient Instructions (Signed)

## 2023-08-09 ENCOUNTER — Ambulatory Visit: Admitting: *Deleted

## 2023-08-09 VITALS — Ht 67.0 in | Wt 201.0 lb

## 2023-08-09 DIAGNOSIS — Z0000 Encounter for general adult medical examination without abnormal findings: Secondary | ICD-10-CM | POA: Diagnosis not present

## 2023-08-09 NOTE — Patient Instructions (Signed)
 [name] , Thank you for taking time to come for your Medicare Wellness Visit. I appreciate your ongoing commitment to your health goals. Please review the following plan we discussed and let me know if I can assist you in the future.   Screening recommendations/referrals: Colonoscopy: up to date Mammogram: up to date Bone Density: schedule Recommended yearly ophthalmology/optometry visit for glaucoma screening and checkup Recommended yearly dental visit for hygiene and checkup  Vaccinations: Influenza vaccine: up to date Pneumococcal vaccine: up to date Tdap vaccine: up to date       Preventive Care 65 Years and Older, Female Preventive care refers to lifestyle choices and visits with your health care provider that can promote health and wellness. What does preventive care include? A yearly physical exam. This is also called an annual well check. Dental exams once or twice a year. Routine eye exams. Ask your health care provider how often you should have your eyes checked. Personal lifestyle choices, including: Daily care of your teeth and gums. Regular physical activity. Eating a healthy diet. Avoiding tobacco and drug use. Limiting alcohol use. Practicing safe sex. Taking low-dose aspirin every day. Taking vitamin and mineral supplements as recommended by your health care provider. What happens during an annual well check? The services and screenings done by your health care provider during your annual well check will depend on your age, overall health, lifestyle risk factors, and family history of disease. Counseling  Your health care provider may ask you questions about your: Alcohol use. Tobacco use. Drug use. Emotional well-being. Home and relationship well-being. Sexual activity. Eating habits. History of falls. Memory and ability to understand (cognition). Work and work Astronomer. Reproductive health. Screening  You may have the following tests or  measurements: Height, weight, and BMI. Blood pressure. Lipid and cholesterol levels. These may be checked every 5 years, or more frequently if you are over 34 years old. Skin check. Lung cancer screening. You may have this screening every year starting at age 30 if you have a 30-pack-year history of smoking and currently smoke or have quit within the past 15 years. Fecal occult blood test (FOBT) of the stool. You may have this test every year starting at age 60. Flexible sigmoidoscopy or colonoscopy. You may have a sigmoidoscopy every 5 years or a colonoscopy every 10 years starting at age 2. Hepatitis C blood test. Hepatitis B blood test. Sexually transmitted disease (STD) testing. Diabetes screening. This is done by checking your blood sugar (glucose) after you have not eaten for a while (fasting). You may have this done every 1-3 years. Bone density scan. This is done to screen for osteoporosis. You may have this done starting at age 7. Mammogram. This may be done every 1-2 years. Talk to your health care provider about how often you should have regular mammograms. Talk with your health care provider about your test results, treatment options, and if necessary, the need for more tests. Vaccines  Your health care provider may recommend certain vaccines, such as: Influenza vaccine. This is recommended every year. Tetanus, diphtheria, and acellular pertussis (Tdap, Td) vaccine. You may need a Td booster every 10 years. Zoster vaccine. You may need this after age 9. Pneumococcal 13-valent conjugate (PCV13) vaccine. One dose is recommended after age 38. Pneumococcal polysaccharide (PPSV23) vaccine. One dose is recommended after age 41. Talk to your health care provider about which screenings and vaccines you need and how often you need them. This information is not intended to replace  advice given to you by your health care provider. Make sure you discuss any questions you have with your  health care provider. Document Released: 02/13/2015 Document Revised: 10/07/2015 Document Reviewed: 11/18/2014 Elsevier Interactive Patient Education  2017 [employer].  Fall Prevention in the Home Falls can cause injuries. They can happen to people of all ages. There are many things you can do to make your home safe and to help prevent falls. What can I do on the outside of my home? Regularly fix the edges of walkways and driveways and fix any cracks. Remove anything that might make you trip as you walk through a door, such as a raised step or threshold. Trim any bushes or trees on the path to your home. Use bright outdoor lighting. Clear any walking paths of anything that might make someone trip, such as rocks or tools. Regularly check to see if handrails are loose or broken. Make sure that both sides of any steps have handrails. Any raised decks and porches should have guardrails on the edges. Have any leaves, snow, or ice cleared regularly. Use sand or salt on walking paths during winter. Clean up any spills in your garage right away. This includes oil or grease spills. What can I do in the bathroom? Use night lights. Install grab bars by the toilet and in the tub and shower. Do not use towel bars as grab bars. Use non-skid mats or decals in the tub or shower. If you need to sit down in the shower, use a plastic, non-slip stool. Keep the floor dry. Clean up any water that spills on the floor as soon as it happens. Remove soap buildup in the tub or shower regularly. Attach bath mats securely with double-sided non-slip rug tape. Do not have throw rugs and other things on the floor that can make you trip. What can I do in the bedroom? Use night lights. Make sure that you have a light by your bed that is easy to reach. Do not use any sheets or blankets that are too big for your bed. They should not hang down onto the floor. Have a firm chair that has side arms. You can use this for  support while you get dressed. Do not have throw rugs and other things on the floor that can make you trip. What can I do in the kitchen? Clean up any spills right away. Avoid walking on wet floors. Keep items that you use a lot in easy-to-reach places. If you need to reach something above you, use a strong step stool that has a grab bar. Keep electrical cords out of the way. Do not use floor polish or wax that makes floors slippery. If you must use wax, use non-skid floor wax. Do not have throw rugs and other things on the floor that can make you trip. What can I do with my stairs? Do not leave any items on the stairs. Make sure that there are handrails on both sides of the stairs and use them. Fix handrails that are broken or loose. Make sure that handrails are as long as the stairways. Check any carpeting to make sure that it is firmly attached to the stairs. Fix any carpet that is loose or worn. Avoid having throw rugs at the top or bottom of the stairs. If you do have throw rugs, attach them to the floor with carpet tape. Make sure that you have a light switch at the top of the stairs and the bottom  of the stairs. If you do not have them, ask someone to add them for you. What else can I do to help prevent falls? Wear shoes that: Do not have high heels. Have rubber bottoms. Are comfortable and fit you well. Are closed at the toe. Do not wear sandals. If you use a stepladder: Make sure that it is fully opened. Do not climb a closed stepladder. Make sure that both sides of the stepladder are locked into place. Ask someone to hold it for you, if possible. Clearly mark and make sure that you can see: Any grab bars or handrails. First and last steps. Where the edge of each step is. Use tools that help you move around (mobility aids) if they are needed. These include: Canes. Walkers. Scooters. Crutches. Turn on the lights when you go into a dark area. Replace any light bulbs as soon  as they burn out. Set up your furniture so you have a clear path. Avoid moving your furniture around. If any of your floors are uneven, fix them. If there are any pets around you, be aware of where they are. Review your medicines with your doctor. Some medicines can make you feel dizzy. This can increase your chance of falling. Ask your doctor what other things that you can do to help prevent falls. This information is not intended to replace advice given to you by your health care provider. Make sure you discuss any questions you have with your health care provider. Document Released: 11/13/2008 Document Revised: 06/25/2015 Document Reviewed: 02/21/2014 Elsevier Interactive Patient Education  2017 [employer].

## 2023-08-09 NOTE — Progress Notes (Signed)
 Subjective:   Diana Rios is a 68 y.o. female who presents for Medicare Annual (Subsequent) preventive examination.  Visit Complete: Virtual I connected with  Adie M Witthuhn on 08/09/23 by a audio enabled telemedicine application and verified that I am speaking with the correct person using two identifiers.  Patient Location: Home  Provider Location: Home Office  I discussed the limitations of evaluation and management by telemedicine. The patient expressed understanding and agreed to proceed.  Vital Signs: Because this visit was a virtual/telehealth visit, some criteria may be missing or patient reported. Any vitals not documented were not able to be obtained and vitals that have been documented are patient reported.   Cardiac Risk Factors include: advanced age (>24men, >77 women);obesity (BMI >30kg/m2);family history of premature cardiovascular disease     Objective:    Today's Vitals   08/09/23 1517  Weight: 201 lb (91.2 kg)  Height: 5' 7 (1.702 m)   Body mass index is 31.48 kg/m.     08/09/2023    3:16 PM 07/06/2022    9:30 AM 05/12/2021   10:58 AM  Advanced Directives  Does Patient Have a Medical Advance Directive? No No No  Would patient like information on creating a medical advance directive? No - Patient declined Yes (MAU/Ambulatory/Procedural Areas - Information given) Yes (MAU/Ambulatory/Procedural Areas - Information given)    Current Medications (verified) Outpatient Encounter Medications as of 08/09/2023  Medication Sig   buPROPion  (WELLBUTRIN  XL) 300 MG 24 hr tablet TAKE 1 TABLET EVERY DAY   cholecalciferol (VITAMIN D3) 25 MCG (1000 UNIT) tablet Take 2,000 Units by mouth daily.   DULoxetine  (CYMBALTA ) 60 MG capsule Take 1 capsule (60 mg total) by mouth daily.   lisinopril  (ZESTRIL ) 30 MG tablet Take 1 tablet (30 mg total) by mouth daily.   Multiple Vitamin (MULTIVITAMIN) capsule Take 1 capsule by mouth daily.   rosuvastatin  (CRESTOR ) 20 MG tablet  Take 1 tablet (20 mg total) by mouth daily.   No facility-administered encounter medications on file as of 08/09/2023.    Allergies (verified) Codeine and Penicillins   History: Past Medical History:  Diagnosis Date   Chicken pox    Depression    Diet-controlled diabetes mellitus (HCC)    Hyperpigmented skin lesion 11/19/2020   Hypertension    Past Surgical History:  Procedure Laterality Date   BREAST BIOPSY Left    Family History  Problem Relation Age of Onset   Heart disease Mother 60       premature   Diabetes Mother    Hypertension Father    Heart disease Sister        heart defect   Diabetes Daughter    Cancer Paternal Grandmother    Breast cancer Neg Hx    Social History   Socioeconomic History   Marital status: Married    Spouse name: Not on file   Number of children: 2   Years of education: [employer] colleg   Highest education level: Some college, no degree  Occupational History   Occupation: Geologist, engineering    Comment: Retired  Tobacco Use   Smoking status: Never   Smokeless tobacco: Never  Advertising account planner   Vaping status: Never Used  Substance and Sexual Activity   Alcohol use: No    Alcohol/week: 0.0 standard drinks of alcohol   Drug use: No   Sexual activity: Yes    Birth control/protection: Post-menopausal  Other Topics Concern   Not on file  Social History Narrative  Ms Mobley relocated from [place], [place]. She lived in [place], [place] for short while. 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   Social Drivers of Corporate investment banker Strain: Low Risk  (08/09/2023)   Overall Financial Resource Strain (CARDIA)    Difficulty of Paying Living Expenses: Not hard at all  Food Insecurity: No Food Insecurity (08/09/2023)   Hunger Vital Sign    Worried About Running Out of Food in the Last Year: Never true    Ran Out of Food in the Last Year: Never true  Transportation Needs: No Transportation Needs (08/09/2023)   PRAPARE -  Administrator, Civil Service (Medical): No    Lack of Transportation (Non-Medical): No  Physical Activity: Inactive (08/09/2023)   Exercise Vital Sign    Days of Exercise per Week: 0 days    Minutes of Exercise per Session: 0 min  Stress: No Stress Concern Present (08/09/2023)   [employer] of Occupational Health - Occupational Stress Questionnaire    Feeling of Stress: Not at all  Social Connections: Socially Integrated (08/09/2023)   Social Connection and Isolation Panel    Frequency of Communication with Friends and Family: More than three times a week    Frequency of Social Gatherings with Friends and Family: More than three times a week    Attends Religious Services: More than 4 times per year    Active Member of [employer] or Organizations: Yes    Attends Engineer, structural: More than 4 times per year    Marital Status: Married    Tobacco Counseling Counseling given: Not Answered   Clinical Intake:  Pre-visit preparation completed: Yes  Pain : No/denies pain     Diabetes: No  How often do you need to have someone help you when you read instructions, pamphlets, or other written materials from your doctor or pharmacy?: 1 - Never  Interpreter Needed?: No  Information entered by :: Mliss Graff LPN   Activities of Daily Living    08/09/2023    3:17 PM  In your present state of health, do you have any difficulty performing the following activities:  Hearing? 0  Vision? 0  Difficulty concentrating or making decisions? 0  Walking or climbing stairs? 0  Dressing or bathing? 0  Doing errands, shopping? 0  Preparing Food and eating ? N  Using the Toilet? N  In the past six months, have you accidently leaked urine? N  Do you have problems with loss of bowel control? N  Managing your Medications? N  Managing your Finances? N  Housekeeping or managing your Housekeeping? N    Patient Care Team: Catherine Charlies LABOR, DO as PCP - General (Family  Medicine) Bethene Larve, DO (Optometry)  Indicate any recent Medical Services you may have received from other than [facility] providers in the past year (date may be approximate).     Assessment:   This is a routine wellness examination for [name].  Hearing/Vision screen Hearing Screening - Comments:: No trouble hearing Vision Screening - Comments:: [facility] Up to date   Goals Addressed             This Visit's Progress    Patient Stated   Not on track    Lose weight      Patient Stated   Not on track    Exercise more      Weight (lb) < 200 lb (90.7 kg)   201 lb (91.2  kg)    Be more active       Depression Screen    08/09/2023    3:21 PM 02/15/2023    9:19 AM 08/31/2022    7:56 AM 07/06/2022    9:30 AM 02/11/2022   10:33 AM 01/18/2022    9:07 AM 10/19/2021    9:40 AM  PHQ 2/9 Scores  PHQ - 2 Score 0 0 0 0 0 0 0  PHQ- 9 Score 4 8 2     0    Fall Risk    08/09/2023    3:13 PM 02/15/2023    9:19 AM 08/31/2022    7:56 AM 07/06/2022    9:32 AM 02/11/2022    9:46 AM  Fall Risk   Falls in the past year? 0 0 0 0 0  Number falls in past yr: 0 0 0 0   Injury with Fall? 0 0 0 0   Risk for fall due to :  No Fall Risks No Fall Risks Impaired vision   Follow up Falls evaluation completed;Education provided;Falls prevention discussed Falls evaluation completed Falls evaluation completed Falls prevention discussed     MEDICARE RISK AT HOME: Medicare Risk at Home Any stairs in or around the home?: Yes If so, are there any without handrails?: No Home free of loose throw rugs in walkways, pet beds, electrical cords, etc?: Yes Adequate lighting in your home to reduce risk of falls?: Yes Life alert?: No Use of a cane, walker or w/c?: No Grab bars in the bathroom?: Yes Shower chair or bench in shower?: Yes Elevated toilet seat or a handicapped toilet?: Yes  TIMED UP AND GO:  Was the test performed?  No    Cognitive Function:        08/09/2023    3:18 PM 07/06/2022     9:32 AM 05/12/2021   11:00 AM  6CIT Screen  What Year? 0 points 0 points 0 points  What month? 0 points 0 points 0 points  What time? 0 points 0 points 0 points  Count back from 20 0 points 0 points 0 points  Months in reverse 0 points 0 points 0 points  Repeat phrase 0 points 0 points 0 points  Total Score 0 points 0 points 0 points    Immunizations Immunization History  Administered Date(s) Administered   Fluad Quad(high Dose 65+) 11/19/2020, 10/01/2021   Influenza Split 11/04/2015   Influenza, Quadrivalent, Recombinant, Inj, Pf 10/27/2018   Influenza,inj,Quad PF,6+ Mos 11/19/2016, 11/29/2017   Influenza-Unspecified 10/04/2012, 11/14/2013, 10/19/2014, 11/04/2015, 10/31/2019, 10/11/2022   PFIZER(Purple Top)SARS-COV-2 Vaccination 03/12/2019, 04/06/2019, 11/05/2019, 07/31/2020   Pneumococcal Conjugate-13 03/02/2016   Pneumococcal Polysaccharide-23 01/10/2020   Tdap 06/01/2011, 10/04/2012, 10/26/2022   Zoster Recombinant(Shingrix) 10/01/2022, 01/17/2023   Zoster, Live 11/04/2015    TDAP status: Up to date  Flu Vaccine status: Up to date  Pneumococcal vaccine status: Up to date  Covid-19 vaccine status: Information provided on how to obtain vaccines.   Qualifies for Shingles Vaccine? Yes   Zostavax completed No   Shingrix Completed?: No.    Education has been provided regarding the importance of this vaccine. Patient has been advised to call insurance company to determine out of pocket expense if they have not yet received this vaccine. Advised may also receive vaccine at local pharmacy or Health Dept. Verbalized acceptance and understanding.  Screening Tests Health Maintenance  Topic Date Due   DEXA SCAN  10/17/2021   INFLUENZA VACCINE  09/01/2023   Medicare Annual Wellness (AWV)  08/08/2024   Fecal DNA (Cologuard)  11/07/2024   MAMMOGRAM  02/09/2025   DTaP/Tdap/Td (4 - Td or Tdap) 10/25/2032   Pneumococcal Vaccine: 50+ Years  Completed   Hepatitis B Vaccines  Aged Out    HPV VACCINES  Aged Out   Meningococcal B Vaccine  Aged Out   COVID-19 Vaccine  Discontinued   Hepatitis C Screening  Discontinued   Zoster Vaccines- Shingrix  Discontinued    Health Maintenance  Health Maintenance Due  Topic Date Due   DEXA SCAN  10/17/2021    Colorectal cancer screening: Type of screening: Cologuard. Completed 2023. Repeat every 3 years  Mammogram status: Completed  . Repeat every year  Bone Density has an order will schedule  Lung Cancer Screening: (Low Dose CT Chest recommended if Age 51-80 years, 20 pack-year currently smoking OR have quit w/in 15years.) does not qualify.   Lung Cancer Screening Referral:   Additional Screening:  Hepatitis C Screening  never done  Vision Screening: Recommended annual ophthalmology exams for early detection of glaucoma and other disorders of the eye. Is the patient up to date with their annual eye exam?  Yes  Who is the provider or what is the name of the office in which the patient attends annual eye exams? [facility] If pt is not established with a provider, would they like to be referred to a provider to establish care? No .   Dental Screening: Recommended annual dental exams for proper oral hygiene    Community Resource Referral / Chronic Care Management: CRR required this visit?  No   CCM required this visit?  No     Plan:     I have personally reviewed and noted the following in the patient's chart:   Medical and social history Use of alcohol, tobacco or illicit drugs  Current medications and supplements including opioid prescriptions. Patient is not currently taking opioid prescriptions. Functional ability and status Nutritional status Physical activity Advanced directives List of other physicians Hospitalizations, surgeries, and ER visits in previous 12 months Vitals Screenings to include cognitive, depression, and falls Referrals and appointments  In addition, I have reviewed and  discussed with patient certain preventive protocols, quality metrics, and best practice recommendations. A written personalized care plan for preventive services as well as general preventive health recommendations were provided to patient.     Mliss Graff, LPN   2/0/7974   After Visit Summary: (MyChart) Due to this being a telephonic visit, the after visit summary with patients personalized plan was offered to patient via MyChart   Nurse Notes:

## 2023-09-05 ENCOUNTER — Ambulatory Visit: Payer: Self-pay

## 2023-09-05 NOTE — Telephone Encounter (Signed)
 FYI Only or Action Required?: FYI only for provider.  Patient was last seen in primary care on 02/15/2023 by Catherine Fuller A, DO.  Called Nurse Triage reporting Knee Injury.  Symptoms began several weeks ago.  Interventions attempted: OTC medications: Tylenol.  Symptoms are: gradually worsening.  Triage Disposition: See Physician Within 24 Hours  Patient/caregiver understands and will follow disposition?: Yes    Copied from CRM #[id number]. Topic: Clinical - Red Word Triage >> Sep 05, 2023 10:10 AM Pinkey ORN wrote: Red Word that prompted transfer to Nurse Triage: Fall >> Sep 05, 2023 10:11 AM Pinkey ORN wrote: Patient states she had a fall on her right knee about 2 1/2 weeks ago, patient states she's experiencing pain.  Reason for Disposition  [1] MODERATE pain (e.g., interferes with normal activities, limping) AND [2] high-risk adult (e.g., age > 60 years, osteoporosis, chronic steroid use)  Answer Assessment - Initial Assessment Questions Patient denies hitting head when she fell.   1. MECHANISM: How did the injury happen? (e.g., twisting injury, direct blow)      Fell down steps while carrying something  2. ONSET: When did the injury happen? (e.g., minutes, hours ago)      2.5 weeks ago  3. LOCATION: Where is the injury located?      Right knee  4. APPEARANCE of INJURY: What does the injury look like?      No open cuts, swelling, bruising  5. SEVERITY: Can you put weight on that leg? Can you walk?      Patient states she is okay if she is actively walking or sitting she is okay, but the motion of standing up and sitting down is what hurts  6. SIZE: For cuts, bruises, or swelling, ask: How large is it? (e.g., inches or centimeters;  entire joint)      No cuts, bruises, or swelling  7. PAIN: Is there pain? If Yes, ask: How bad is the pain?   What does it keep you from doing? (Scale 0-10; or none, mild, moderate, severe)     Pain gets up to around  3  8. TETANUS: For any breaks in the skin, ask: When was your last tetanus booster?     No open cuts  9. OTHER SYMPTOMS: Do you have any other symptoms?  (e.g., pop when knee injured, swelling, locking, buckling)      Patient states she has heard a couple of pops  Protocols used: Knee Injury-A-AH

## 2023-09-05 NOTE — Telephone Encounter (Signed)
 Pt scheduled

## 2023-09-06 ENCOUNTER — Ambulatory Visit: Admitting: Family Medicine

## 2023-09-06 ENCOUNTER — Encounter: Payer: Self-pay | Admitting: Family Medicine

## 2023-09-06 VITALS — BP 128/80 | HR 73 | Temp 98.0°F | Wt 210.2 lb

## 2023-09-06 DIAGNOSIS — S86911A Strain of unspecified muscle(s) and tendon(s) at lower leg level, right leg, initial encounter: Secondary | ICD-10-CM | POA: Diagnosis not present

## 2023-09-06 MED ORDER — PREDNISONE 20 MG PO TABS
ORAL_TABLET | ORAL | 0 refills | Status: DC
Start: 2023-09-06 — End: 2023-10-17

## 2023-09-06 NOTE — Patient Instructions (Addendum)

## 2023-09-06 NOTE — Progress Notes (Signed)
 Diana Rios , 1955/12/24, 68 y.o., female MRN: [id number] Patient Care Team    Relationship Specialty Notifications Start End  Catherine Charlies LABOR, DO PCP - General Family Medicine  11/20/14   Bethene Larve, DO  Optometry  03/02/16    Comment: [facility]    Chief Complaint  Patient presents with   Knee Pain    Pt fell 2 weeks ago causing R knee pain. Pt has taken Ibuprofen.      Subjective: Diana Rios is a 68 y.o. Pt presents for an OV with complaints of right knee pain after fall  of 2 weeks ago-duration.  Associated symptoms include pain over the medial aspect of her knee.  She reports she had mild swelling of her knee her ankle.  She sustained a bruise to right lower medial shin. Pt has tried ibuprofen to ease their symptoms.  She reports she was coming down the stairs when she lost her footing.  She had both hands on the banister and she knocked her her a decorative jar during her fall.  She believes the injury to her knee occurred from twisting. She reports overall pain has improved as well as swelling.  She notices discomfort when flexing extending the and climbing stairs.  She denies any prior injury to her knee or surgical procedures.     08/09/2023    3:21 PM 02/15/2023    9:19 AM 08/31/2022    7:56 AM 07/06/2022    9:30 AM 02/11/2022   10:33 AM  Depression screen PHQ 2/9  Decreased Interest 0 0 0 0 0  Down, Depressed, Hopeless 0 0 0 0 0  PHQ - 2 Score 0 0 0 0 0  Altered sleeping 2 3 0    Tired, decreased energy 2 2 0    Change in appetite 0 0 1    Feeling bad or failure about yourself  0 3 1    Trouble concentrating 0 0 0    Moving slowly or fidgety/restless 0 0 0    Suicidal thoughts 0 0 0    PHQ-9 Score 4 8 2     Difficult doing work/chores Not difficult at all Somewhat difficult       Allergies  Allergen Reactions   Codeine Itching   Penicillins Itching   Social History   Social History Narrative   Ms Payment relocated from  [place], [place]. She lived in [place], [place] for short while. 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   Past Medical History:  Diagnosis Date   Chicken pox    Depression    Diet-controlled diabetes mellitus (HCC)    Hyperpigmented skin lesion 11/19/2020   Hypertension    Past Surgical History:  Procedure Laterality Date   BREAST BIOPSY Left    Family History  Problem Relation Age of Onset   Heart disease Mother 26       premature   Diabetes Mother    Hypertension Father    Heart disease Sister        heart defect   Diabetes Daughter    Cancer Paternal Grandmother    Breast cancer Neg Hx    Allergies as of 09/06/2023       Reactions   Codeine Itching   Penicillins Itching        Medication List        Accurate as of September 06, 2023  1:02 PM. If you have any questions, ask  your nurse or doctor.          buPROPion  300 MG 24 hr tablet Commonly known as: WELLBUTRIN  XL TAKE 1 TABLET EVERY DAY   cholecalciferol 25 MCG (1000 UNIT) tablet Commonly known as: VITAMIN D3 Take 2,000 Units by mouth daily.   DULoxetine  60 MG capsule Commonly known as: CYMBALTA  Take 1 capsule (60 mg total) by mouth daily.   lisinopril  30 MG tablet Commonly known as: ZESTRIL  Take 1 tablet (30 mg total) by mouth daily.   multivitamin capsule Take 1 capsule by mouth daily.   predniSONE  20 MG tablet Commonly known as: DELTASONE  60 mg x2d, 40 mg x4d, 20 mg x3d, 10 mg x2d Started by: Charlies Bellini   rosuvastatin  20 MG tablet Commonly known as: CRESTOR  Take 1 tablet (20 mg total) by mouth daily.        All past medical history, surgical history, allergies, family history, immunizations andmedications were updated in the EMR today and reviewed under the history and medication portions of their EMR.     ROS Negative, with the exception of above mentioned in HPI   Objective:  BP 128/80   Pulse 73   Temp 98 F (36.7 C)   Wt 210 lb 3.2 oz (95.3 kg)   SpO2 96%    BMI 32.92 kg/m  Body mass index is 32.92 kg/m. Physical Exam Vitals and nursing note reviewed.  Constitutional:      General: She is not in acute distress.    Appearance: Normal appearance. She is normal weight. She is not ill-appearing or toxic-appearing.  HENT:     Head: Normocephalic and atraumatic.  Eyes:     General: No scleral icterus.       Right eye: No discharge.        Left eye: No discharge.     Extraocular Movements: Extraocular movements intact.     Conjunctiva/sclera: Conjunctivae normal.     Pupils: Pupils are equal, round, and reactive to light.  Musculoskeletal:        General: Swelling present. No tenderness. Normal range of motion.     Right knee: Swelling, effusion and crepitus present. No erythema or bony tenderness. Normal range of motion. No tenderness. No LCL laxity, MCL laxity, ACL laxity or PCL laxity. Normal alignment, normal meniscus and normal patellar mobility.     Instability Tests: Anterior drawer test negative. Posterior drawer test negative. Anterior Lachman test negative. Medial McMurray test negative and lateral McMurray test negative.     Left knee: Normal.  Skin:    Findings: No rash.  Neurological:     Mental Status: She is alert and oriented to person, place, and time. Mental status is at baseline.     Motor: No weakness.     Coordination: Coordination normal.     Gait: Gait normal.  Psychiatric:        Mood and Affect: Mood normal.        Behavior: Behavior normal.        Thought Content: Thought content normal.        Judgment: Judgment normal.     No results found. No results found. No results found for this or any previous visit (from the past 24 hours).  Assessment/Plan: Diana Rios is a 68 y.o. female present for OV for  Knee strain, right, initial encounter (Primary) Her exam today is overall reassuring.  There is a small effusion. Encouraged her to use a knee compression sleeve for the next 2-4 weeks,  she states she  has a sleeve at home. Prednisone  taper prescribed today Suspect she will not need to follow-up, however her symptoms are still present in 4 weeks, not seeing improvement I would have her follow-up at that time to consider x-ray and plus or physical therapy referral to Ortho.  Reviewed expectations re: course of current medical issues. Discussed self-management of symptoms. Outlined signs and symptoms indicating need for more acute intervention. Patient verbalized understanding and all questions were answered. Patient received an After-Visit Summary.    No orders of the defined types were placed in this encounter.  Meds ordered this encounter  Medications   predniSONE  (DELTASONE ) 20 MG tablet    Sig: 60 mg x2d, 40 mg x4d, 20 mg x3d, 10 mg x2d    Dispense:  18 tablet    Refill:  0   Referral Orders  No referral(s) requested today     Note is dictated utilizing voice recognition software. Although note has been proof read prior to signing, occasional typographical errors still can be missed. If any questions arise, please do not hesitate to call for verification.   electronically signed by:  Charlies Bellini, DO  [facility] [place]

## 2023-09-08 ENCOUNTER — Other Ambulatory Visit: Payer: Medicare PPO

## 2023-10-17 ENCOUNTER — Encounter: Payer: Self-pay | Admitting: Family Medicine

## 2023-10-17 ENCOUNTER — Ambulatory Visit: Admitting: Family Medicine

## 2023-10-17 VITALS — BP 126/78 | HR 74 | Temp 98.1°F | Wt 206.0 lb

## 2023-10-17 DIAGNOSIS — R7309 Other abnormal glucose: Secondary | ICD-10-CM

## 2023-10-17 DIAGNOSIS — F32A Depression, unspecified: Secondary | ICD-10-CM

## 2023-10-17 DIAGNOSIS — E785 Hyperlipidemia, unspecified: Secondary | ICD-10-CM | POA: Diagnosis not present

## 2023-10-17 DIAGNOSIS — M8580 Other specified disorders of bone density and structure, unspecified site: Secondary | ICD-10-CM | POA: Diagnosis not present

## 2023-10-17 DIAGNOSIS — I10 Essential (primary) hypertension: Secondary | ICD-10-CM | POA: Diagnosis not present

## 2023-10-17 DIAGNOSIS — E559 Vitamin D deficiency, unspecified: Secondary | ICD-10-CM | POA: Diagnosis not present

## 2023-10-17 DIAGNOSIS — Z23 Encounter for immunization: Secondary | ICD-10-CM

## 2023-10-17 DIAGNOSIS — S86911A Strain of unspecified muscle(s) and tendon(s) at lower leg level, right leg, initial encounter: Secondary | ICD-10-CM

## 2023-10-17 DIAGNOSIS — S86911D Strain of unspecified muscle(s) and tendon(s) at lower leg level, right leg, subsequent encounter: Secondary | ICD-10-CM | POA: Insufficient documentation

## 2023-10-17 DIAGNOSIS — F418 Other specified anxiety disorders: Secondary | ICD-10-CM

## 2023-10-17 MED ORDER — DULOXETINE HCL 60 MG PO CPEP: 60.0000 mg | ORAL_CAPSULE | Freq: Every day | ORAL | 1 refills | Status: AC

## 2023-10-17 MED ORDER — LISINOPRIL 30 MG PO TABS: 30.0000 mg | ORAL_TABLET | Freq: Every day | ORAL | 1 refills | Status: AC

## 2023-10-17 MED ORDER — BUPROPION HCL ER (XL) 300 MG PO TB24: ORAL_TABLET | ORAL | 1 refills | Status: AC

## 2023-10-17 NOTE — Progress Notes (Signed)
 Patient ID: Diana Rios, female  DOB: April 20, 1955, 68 y.o.   MRN: [id number] Patient Care Team    Relationship Specialty Notifications Start End  Diana Rios LABOR, DO PCP - General Family Medicine  11/20/14   Diana Larve, DO  Optometry  03/02/16    Comment: Myeyedoc- bonni   Chief Complaint  Patient presents with   Hypertension    Chronic Conditions/illness Management Influenza vaccine    Subjective: Diana Rios is a 68 y.o.  Female  present for Chronic Conditions/illness Management Medication reconciliation completed today All past medical history updated today were appropriate  Depression with anxiety: Patient reports compliance with Wellbutrin  300 mg daily and Cymbalta  60 mg daily.  She feels the medication is working well or her.  Pt has suffered with depression and anxiety most of her life.She feels July is always rough for her, she is uncertain why.   Hypertension/morbid obesity: Pt reports compliance with lisinopril  30 mg QD.  Patient denies chest pain, shortness of breath, dizziness or lower extremity edema.   RF: Hypertension, diabetes, obesity, family history of heart disease  Vitamin D  deficiency: She is supplementing daily-2000u  Right knee pain: Patient was seen 5 weeks ago after likely twisting injury of her right knee.  She was encouraged to rest, ice, steroid taper provided and use compression sleeve daily.  She reports she still having pretty significant discomfort especially with walking and extending knee.  She denies any instability.  No additional swelling present.  Prior note 09/06/2023 Pt presents for an OV with complaints of right knee pain after fall  of 2 weeks ago-duration.  Associated symptoms include pain over the medial aspect of her knee.  She reports she had mild swelling of her knee her ankle.  She sustained a bruise to right lower medial shin. Pt has tried ibuprofen to ease their symptoms.  She reports she was coming down the  stairs when she lost her footing.  She had both hands on the banister and she knocked her her a decorative jar during her fall.  She believes the injury to her knee occurred from twisting. She reports overall pain has improved as well as swelling.  She notices discomfort when flexing extending the and climbing stairs.  She denies any prior injury to her knee or surgical procedures.     08/09/2023    3:21 PM 02/15/2023    9:19 AM 08/31/2022    7:56 AM 07/06/2022    9:30 AM 02/11/2022   10:33 AM  Depression screen PHQ 2/9  Decreased Interest 0 0 0 0 0  Down, Depressed, Hopeless 0 0 0 0 0  PHQ - 2 Score 0 0 0 0 0  Altered sleeping 2 3 0    Tired, decreased energy 2 2 0    Change in appetite 0 0 1    Feeling bad or failure about yourself  0 3 1    Trouble concentrating 0 0 0    Moving slowly or fidgety/restless 0 0 0    Suicidal thoughts 0 0 0    PHQ-9 Score 4 8 2     Difficult doing work/chores Not difficult at all Somewhat difficult         02/15/2023    9:19 AM 10/19/2021    9:40 AM 11/19/2020    8:46 AM 01/07/2019    1:32 PM  GAD 7 : Generalized Anxiety Score  Nervous, Anxious, on Edge 1 0 1 1  Control/stop worrying 1 0  1 1  Worry too much - different things 1 0 1 0  Trouble relaxing 1 0 0 0  Restless 0 0 0 0  Easily annoyed or irritable 1 0 1 0  Afraid - awful might happen 0 0 1 0  Total GAD 7 Score 5 0 5 2  Anxiety Difficulty Somewhat difficult   Not difficult at all    Immunization History  Administered Date(s) Administered   Fluad Quad(high Dose 65+) 11/19/2020, 10/01/2021   INFLUENZA, HIGH DOSE SEASONAL PF 10/13/2023   Influenza Split 11/04/2015   Influenza, Quadrivalent, Recombinant, Inj, Pf 10/27/2018   Influenza,inj,Quad PF,6+ Mos 11/19/2016, 11/29/2017   Influenza-Unspecified 10/04/2012, 11/14/2013, 10/19/2014, 11/04/2015, 10/31/2019, 10/11/2022   PFIZER(Purple Top)SARS-COV-2 Vaccination 03/12/2019, 04/06/2019, 11/05/2019, 07/31/2020   Pneumococcal Conjugate-13  03/02/2016   Pneumococcal Polysaccharide-23 01/10/2020   Tdap 06/01/2011, 10/04/2012, 10/26/2022   Zoster Recombinant(Shingrix) 10/01/2022, 01/17/2023   Zoster, Live 11/04/2015    Past Medical History:  Diagnosis Date   Chicken pox    Depression    Diet-controlled diabetes mellitus (HCC)    Hyperpigmented skin lesion 11/19/2020   Hypertension    Allergies  Allergen Reactions   Codeine Itching   Penicillins Itching   Past Surgical History:  Procedure Laterality Date   BREAST BIOPSY Left    Family History  Problem Relation Age of Onset   Heart disease Mother 61       premature   Diabetes Mother    Hypertension Father    Heart disease Sister        heart defect   Diabetes Daughter    Cancer Paternal Grandmother    Breast cancer Neg Hx    Social History   Socioeconomic History   Marital status: Married    Spouse name: Not on file   Number of children: 2   Years of education: [employer]   Highest education level: Some college, no degree  Occupational History   Occupation: Geologist, engineering    Comment: Retired  Tobacco Use   Smoking status: Never   Smokeless tobacco: Never  Vaping Use   Vaping status: Never Used  Substance and Sexual Activity   Alcohol use: No    Alcohol/week: 0.0 standard drinks of alcohol   Drug use: No   Sexual activity: Yes    Birth control/protection: Post-menopausal  Other Topics Concern   Not on file  Social History Narrative   [name] relocated from [place], [place]. She lived in [place], [place] for short while. 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   Social Drivers of Corporate investment banker Strain: Low Risk  (08/09/2023)   Overall Financial Resource Strain (CARDIA)    Difficulty of Paying Living Expenses: Not hard at all  Food Insecurity: No Food Insecurity (08/09/2023)   Hunger Vital Sign    Worried About Running Out of Food in the Last Year: Never true    Ran Out of Food in the Last Year: Never true   Transportation Needs: No Transportation Needs (08/09/2023)   PRAPARE - Administrator, Civil Service (Medical): No    Lack of Transportation (Non-Medical): No  Physical Activity: Inactive (08/09/2023)   Exercise Vital Sign    Days of Exercise per Week: 0 days    Minutes of Exercise per Session: 0 min  Stress: No Stress Concern Present (08/09/2023)   [employer] of Occupational Health - Occupational Stress Questionnaire    Feeling of Stress: Not at all  Social Connections:  Socially Integrated (08/09/2023)   Social Connection and Isolation Panel    Frequency of Communication with Friends and Family: More than three times a week    Frequency of Social Gatherings with Friends and Family: More than three times a week    Attends Religious Services: More than 4 times per year    Active Member of [employer] or Organizations: Yes    Attends Banker Meetings: More than 4 times per year    Marital Status: Married  Catering manager Violence: Not At Risk (08/09/2023)   Humiliation, Afraid, Rape, and Kick questionnaire    Fear of Current or Ex-Partner: No    Emotionally Abused: No    Physically Abused: No    Sexually Abused: No   Allergies as of 10/17/2023       Reactions   Codeine Itching   Penicillins Itching        Medication List        Accurate as of October 17, 2023  3:14 PM. If you have any questions, ask your nurse or doctor.          STOP taking these medications    predniSONE  20 MG tablet Commonly known as: DELTASONE  Stopped by: Rios Bellini       TAKE these medications    buPROPion  300 MG 24 hr tablet Commonly known as: WELLBUTRIN  XL TAKE 1 TABLET EVERY DAY   cholecalciferol 25 MCG (1000 UNIT) tablet Commonly known as: VITAMIN D3 Take 2,000 Units by mouth daily.   DULoxetine  60 MG capsule Commonly known as: CYMBALTA  Take 1 capsule (60 mg total) by mouth daily.   lisinopril  30 MG tablet Commonly known as: ZESTRIL  Take 1 tablet (30 mg  total) by mouth daily.   multivitamin capsule Take 1 capsule by mouth daily.   rosuvastatin  20 MG tablet Commonly known as: CRESTOR  Take 1 tablet (20 mg total) by mouth daily.         No results found for this or any previous visit (from the past 2160 hours).   ROS: 14 pt review of systems performed and negative (unless mentioned in an HPI)  Objective: BP 126/78   Pulse 74   Temp 98.1 F (36.7 C)   Wt 206 lb (93.4 kg)   SpO2 98%   BMI 32.26 kg/m   Physical Exam Vitals and nursing note reviewed.  Constitutional:      General: She is not in acute distress.    Appearance: Normal appearance. She is not ill-appearing, toxic-appearing or diaphoretic.  HENT:     Head: Normocephalic and atraumatic.  Eyes:     General: No scleral icterus.       Right eye: No discharge.        Left eye: No discharge.     Extraocular Movements: Extraocular movements intact.     Conjunctiva/sclera: Conjunctivae normal.     Pupils: Pupils are equal, round, and reactive to light.  Cardiovascular:     Rate and Rhythm: Normal rate and regular rhythm.  Pulmonary:     Effort: Pulmonary effort is normal. No respiratory distress.     Breath sounds: Normal breath sounds. No wheezing, rhonchi or rales.  Musculoskeletal:     Right knee: Bony tenderness and crepitus present. No swelling, effusion, erythema or lacerations. Normal range of motion. Tenderness present over the MCL. No LCL laxity or MCL laxity. Normal alignment.     Instability Tests: Anterior drawer test negative. Posterior drawer test negative. Anterior Lachman test negative. Medial McMurray  test negative and lateral McMurray test negative.     Left knee: Normal.     Right lower leg: No edema.     Left lower leg: No edema.       Legs:  Skin:    General: Skin is warm.     Findings: No rash.  Neurological:     Mental Status: She is alert and oriented to person, place, and time. Mental status is at baseline.     Motor: No weakness.      Gait: Gait normal.  Psychiatric:        Mood and Affect: Mood normal.        Behavior: Behavior normal.        Thought Content: Thought content normal.        Judgment: Judgment normal.    No results found for this or any previous visit (from the past 24 hours).   Assessment/plan: Diana Rios is a 68 y.o. female present for chronic condition management appointment together Elevated A1c - A1c 5.9--> 5.7 --> 5.6 >>5.9 >6.4> >5.6> 6.3> 6.0 >yearly A1c collected today Discussed that she is diet controlled and as long as A1c stays below 6.3 she should not need medications.  -Remove diabetes from her present list> elevated glucose.  She has been well controlled without medications.   - Continue diet and exercise regimen - PNA series: completed  Depression, unspecified depression type Stable Continue Cymbalta  60 mg daily Continue Wellbutrin .  300 mg daily  Essential hypertension, benign/obesity Stable Continue lisinopril  30 mg daily.  Labs due today  Vitamin D  deficiency: Vit d levels collected today Continue 2000 u daily supplement.  DEXA ordered for-MCHP  Right knee strain: Patient still having discomfort despite prednisone  and rest. She has not been great about wearing the knee sleeve.  A sleeve was provided to her today and she was encouraged to consider purchasing a neoprene sleeve. I suspect she may have a strain or partial tear proximal MCL. We discussed options and elected to refer to sports medicine for further evaluation today.  Return in about 24 weeks (around 04/02/2024) for cpe (20 min), Routine chronic condition follow-up.   Orders Placed This Encounter  Procedures   DG Bone Density   CBC   Comp Met (CMET)   TSH   Hemoglobin A1c   Lipid panel   Vitamin D  (25 hydroxy)   Ambulatory referral to Sports Medicine   Meds ordered this encounter  Medications   buPROPion  (WELLBUTRIN  XL) 300 MG 24 hr tablet    Sig: TAKE 1 TABLET EVERY DAY    Dispense:  90  tablet    Refill:  1   DULoxetine  (CYMBALTA ) 60 MG capsule    Sig: Take 1 capsule (60 mg total) by mouth daily.    Dispense:  90 capsule    Refill:  1   lisinopril  (ZESTRIL ) 30 MG tablet    Sig: Take 1 tablet (30 mg total) by mouth daily.    Dispense:  90 tablet    Refill:  1    Referral Orders         Ambulatory referral to Sports Medicine      Electronically signed by: Rios Bellini, DO [facility] [place]

## 2023-10-17 NOTE — Patient Instructions (Addendum)

## 2023-10-18 ENCOUNTER — Ambulatory Visit: Payer: Self-pay | Admitting: Family Medicine

## 2023-10-18 LAB — COMPREHENSIVE METABOLIC PANEL WITH GFR
AG Ratio: 1.8 (calc) (ref 1.0–2.5)
ALT: 19 U/L (ref 6–29)
AST: 19 U/L (ref 10–35)
Albumin: 4.2 g/dL (ref 3.6–5.1)
Alkaline phosphatase (APISO): 102 U/L (ref 37–153)
BUN: 15 mg/dL (ref 7–25)
CO2: 30 mmol/L (ref 20–32)
Calcium: 9.6 mg/dL (ref 8.6–10.4)
Chloride: 105 mmol/L (ref 98–110)
Creat: 0.91 mg/dL (ref 0.50–1.05)
Globulin: 2.4 g/dL (ref 1.9–3.7)
Glucose, Bld: 103 mg/dL — ABNORMAL HIGH (ref 65–99)
Potassium: 4.3 mmol/L (ref 3.5–5.3)
Sodium: 141 mmol/L (ref 135–146)
Total Bilirubin: 0.4 mg/dL (ref 0.2–1.2)
Total Protein: 6.6 g/dL (ref 6.1–8.1)
eGFR: 68 mL/min/1.73m2 (ref >=60)

## 2023-10-18 LAB — LIPID PANEL
Cholesterol: 148 mg/dL (ref <200)
HDL: 69 mg/dL (ref >=50)
LDL Cholesterol (Calc): 52 mg/dL
Non-HDL Cholesterol (Calc): 79 mg/dL (ref <130)
Total CHOL/HDL Ratio: 2.1 (calc) (ref <5.0)
Triglycerides: 196 mg/dL — ABNORMAL HIGH (ref <150)

## 2023-10-18 LAB — CBC
HCT: 43 % (ref 35.0–45.0)
Hemoglobin: 14 g/dL (ref 11.7–15.5)
MCH: 28.7 pg (ref 27.0–33.0)
MCHC: 32.6 g/dL (ref 32.0–36.0)
MCV: 88.3 fL (ref 80.0–100.0)
MPV: 11.8 fL (ref 7.5–12.5)
Platelets: 272 Thousand/uL (ref 140–400)
RBC: 4.87 Million/uL (ref 3.80–5.10)
RDW: 14 % (ref 11.0–15.0)
WBC: 5.6 Thousand/uL (ref 3.8–10.8)

## 2023-10-18 LAB — VITAMIN D 25 HYDROXY (VIT D DEFICIENCY, FRACTURES): Vit D, 25-Hydroxy: 32 ng/mL (ref 30–100)

## 2023-10-18 LAB — TSH: TSH: 1.99 m[IU]/L (ref 0.40–4.50)

## 2023-10-18 LAB — HEMOGLOBIN A1C
Hgb A1c MFr Bld: 6.2 % — ABNORMAL HIGH (ref <5.7)
Mean Plasma Glucose: 131 mg/dL
eAG (mmol/L): 7.3 mmol/L

## 2023-10-31 ENCOUNTER — Ambulatory Visit (HOSPITAL_BASED_OUTPATIENT_CLINIC_OR_DEPARTMENT_OTHER)
Admission: RE | Admit: 2023-10-31 | Discharge: 2023-10-31 | Disposition: A | Discharge status: Home / Self Care | Source: Ambulatory Visit | Attending: Family Medicine | Admitting: Family Medicine

## 2023-10-31 DIAGNOSIS — M8580 Other specified disorders of bone density and structure, unspecified site: Secondary | ICD-10-CM | POA: Diagnosis not present

## 2023-10-31 DIAGNOSIS — M8589 Other specified disorders of bone density and structure, multiple sites: Secondary | ICD-10-CM | POA: Diagnosis not present

## 2023-10-31 DIAGNOSIS — E559 Vitamin D deficiency, unspecified: Secondary | ICD-10-CM | POA: Insufficient documentation

## 2023-10-31 DIAGNOSIS — Z780 Asymptomatic menopausal state: Secondary | ICD-10-CM | POA: Diagnosis not present

## 2023-10-31 DIAGNOSIS — Z13820 Encounter for screening for osteoporosis: Secondary | ICD-10-CM | POA: Diagnosis not present

## 2023-12-30 ENCOUNTER — Other Ambulatory Visit: Payer: Self-pay | Admitting: Family Medicine

## 2024-01-16 ENCOUNTER — Other Ambulatory Visit: Payer: Self-pay | Admitting: Family Medicine

## 2024-01-16 DIAGNOSIS — Z1231 Encounter for screening mammogram for malignant neoplasm of breast: Secondary | ICD-10-CM

## 2024-02-13 ENCOUNTER — Ambulatory Visit
Admission: RE | Admit: 2024-02-13 | Discharge: 2024-02-13 | Disposition: A | Source: Ambulatory Visit | Attending: Family Medicine | Admitting: Family Medicine

## 2024-02-13 DIAGNOSIS — Z1231 Encounter for screening mammogram for malignant neoplasm of breast: Secondary | ICD-10-CM

## 2024-02-15 ENCOUNTER — Ambulatory Visit: Payer: Self-pay | Admitting: Family Medicine

## 2024-04-02 ENCOUNTER — Encounter: Admitting: Family Medicine

## 2024-08-14 ENCOUNTER — Encounter
